# Patient Record
Sex: Female | Born: 1985 | Race: White | Hispanic: No | Marital: Single | Smoking: Former smoker
Health system: Southern US, Community
[De-identification: ages and names within clinical notes are randomized; demographics above are authoritative.]

## PROBLEM LIST (undated history)

## (undated) DIAGNOSIS — F17200 Nicotine dependence, unspecified, uncomplicated: Secondary | ICD-10-CM

## (undated) DIAGNOSIS — F32A Depression, unspecified: Secondary | ICD-10-CM

## (undated) DIAGNOSIS — F419 Anxiety disorder, unspecified: Secondary | ICD-10-CM

## (undated) DIAGNOSIS — F319 Bipolar disorder, unspecified: Secondary | ICD-10-CM

## (undated) DIAGNOSIS — F329 Major depressive disorder, single episode, unspecified: Secondary | ICD-10-CM

## (undated) DIAGNOSIS — F1290 Cannabis use, unspecified, uncomplicated: Secondary | ICD-10-CM

## (undated) HISTORY — DX: Depression, unspecified: F32.A

## (undated) HISTORY — PX: TYMPANOSTOMY: SHX2586

## (undated) HISTORY — DX: Major depressive disorder, single episode, unspecified: F32.9

## (undated) HISTORY — DX: Anxiety disorder, unspecified: F41.9

## (undated) HISTORY — DX: Bipolar disorder, unspecified: F31.9

---

## 2013-07-18 ENCOUNTER — Encounter (INDEPENDENT_AMBULATORY_CARE_PROVIDER_SITE_OTHER): Payer: Self-pay

## 2013-07-18 ENCOUNTER — Encounter: Payer: Self-pay | Admitting: Family Medicine

## 2013-07-18 ENCOUNTER — Ambulatory Visit: Payer: Self-pay | Admitting: Family Medicine

## 2013-07-18 VITALS — BP 107/64 | HR 70 | Temp 97.9°F | Ht 69.0 in | Wt 168.0 lb

## 2013-07-18 DIAGNOSIS — R21 Rash and other nonspecific skin eruption: Secondary | ICD-10-CM

## 2013-07-18 DIAGNOSIS — B354 Tinea corporis: Secondary | ICD-10-CM

## 2013-07-18 DIAGNOSIS — L218 Other seborrheic dermatitis: Secondary | ICD-10-CM

## 2013-07-18 DIAGNOSIS — L210 Seborrhea capitis: Secondary | ICD-10-CM | POA: Insufficient documentation

## 2013-07-18 MED ORDER — KETOCONAZOLE 2 % EX SHAM: 1.0000 "application " | MEDICATED_SHAMPOO | CUTANEOUS | Status: DC

## 2013-07-18 MED ORDER — KETOCONAZOLE-HYDROCORTISONE 2 & 1 % (CREAM) EX KIT: PACK | CUTANEOUS | Status: DC

## 2013-07-18 NOTE — Progress Notes (Signed)
Patient ID: Kimberly Potter, female   DOB: [birth year], 28 y.o.   MRN: [id number] SUBJECTIVE: CC: Chief Complaint  Patient presents with  . Acute Visit    rash x 2weeks using nerium firm cream . (NEW PT) states been here 7 yrs ago    HPI: Total weight loss at [employer] > 100lbs in 1 1/2 years. Works at [employer] now. Has excess skin. Rash broke out behind the right ear. And sudden dandruff of the scalp and rash on the left shoulder. No fevr. No STIs.  No new hair products. No past medical history on file. No past surgical history on file. History   Social History  . Marital Status: Single    Spouse Name: N/A    Number of Children: N/A  . Years of Education: N/A   Occupational History  . Not on file.   Social History Main Topics  . Smoking status: Never Smoker   . Smokeless tobacco: Not on file  . Alcohol Use: Not on file  . Drug Use: Not on file  . Sexual Activity: Not on file   Other Topics Concern  . Not on file   Social History Narrative  . No narrative on file   No family history on file. No current outpatient prescriptions on file prior to visit.   No current facility-administered medications on file prior to visit.   Not on File  There is no immunization history on file for this patient. Prior to Admission medications   Not on File     ROS: As above in the HPI. All other systems are stable or negative.  OBJECTIVE: APPEARANCE:  Patient in no acute distress.The patient appeared well nourished and normally developed. Acyanotic. Waist: VITAL SIGNS:BP 107/64  Pulse 70  Temp(Src) 97.9 F (36.6 C) (Oral)  Ht _0  (1.753 m)  Wt 168 lb (76.204 kg)  BMI 24.80 kg/m2  LMP 07/15/2013 [facility]  SKIN: warm and  Dry with rash on the right neck and behind the right ear. Scaly circar lesions with hypopigmentation. Lesions are 1 cm diameter. Central clearing.  small multiple 4 mm plaques on the left trapezius / shoulder. Marked dandruff of the scalp.  HEAD and Neck:  without JVD, Head and scalp: normal Eyes:No scleral icterus. Fundi normal, eye movements normal. Ears: Auricle normal, canal normal, Tympanic membranes normal, insufflation normal. Nose: normal Throat: normal Neck & thyroid: normal  CHEST & LUNGS: Chest wall: normal Lungs: Clear  CVS: Reveals the PMI to be normally located. Regular rhythm, First and Second Heart sounds are normal,  absence of murmurs, rubs or gallops. Peripheral vasculature: Radial pulses: normal Dorsal pedis pulses: normal Posterior pulses: normal  ABDOMEN:  Appearance: normal Benign, no organomegaly, no masses, no Abdominal Aortic enlargement. No Guarding , no rebound. No Bruits. Bowel sounds: normal  RECTAL: N/A GU: N/A  EXTREMETIES: nonedematous.  MUSCULOSKELETAL:  Spine: normal Joints: intact Soft tissue excess skin at the triceps and waist. And hips.   NEUROLOGIC: oriented to time,place and person; nonfocal. Strength is normal Sensory is normal Reflexes are normal Cranial Nerves are normal.  ASSESSMENT:  Dandruff - Plan: ketoconazole (NIZORAL) 2 % shampoo  Rash and nonspecific skin eruption - Plan: Ketoconazole-Hydrocortisone 2 & 1 % (CREAM) KIT  Tinea corporis - Plan: Ketoconazole-Hydrocortisone 2 & 1 % (CREAM) KIT  PLAN: Discussed wit patient.  Use hypoallergenic products.  No orders of the defined types were placed in this encounter.   Meds ordered this encounter  Medications  . ketoconazole (NIZORAL)  2 % shampoo    Sig: Apply 1 application topically 2 (two) times a week. Use for 8 weeks.    Dispense:  120 mL    Refill:  2  . Ketoconazole-Hydrocortisone 2 & 1 % (CREAM) KIT    Sig: Apply to rash on the neck twice a day for 2 weeks.    Dispense:  45 g    Refill:  1   There are no discontinued medications. Return if symptoms worsen or fail to improve. If no improvement in 2 weeks. Patient to call for a referral to dermatology.  Abem Shaddix P. Jacelyn Grip, M.D.

## 2015-10-10 ENCOUNTER — Ambulatory Visit (INDEPENDENT_AMBULATORY_CARE_PROVIDER_SITE_OTHER): Payer: BLUE CROSS/BLUE SHIELD | Admitting: Family Medicine

## 2015-10-10 ENCOUNTER — Encounter: Payer: Self-pay | Admitting: Family Medicine

## 2015-10-10 VITALS — BP 99/61 | HR 79 | Temp 98.0°F | Ht 68.5 in | Wt 224.4 lb

## 2015-10-10 DIAGNOSIS — F32A Depression, unspecified: Secondary | ICD-10-CM

## 2015-10-10 DIAGNOSIS — F418 Other specified anxiety disorders: Secondary | ICD-10-CM

## 2015-10-10 DIAGNOSIS — F419 Anxiety disorder, unspecified: Principal | ICD-10-CM

## 2015-10-10 DIAGNOSIS — F329 Major depressive disorder, single episode, unspecified: Secondary | ICD-10-CM

## 2015-10-10 MED ORDER — ESCITALOPRAM OXALATE 20 MG PO TABS: 20.0000 mg | ORAL_TABLET | Freq: Every day | ORAL | Status: DC

## 2015-10-10 MED ORDER — HYDROXYZINE HCL 10 MG PO TABS: 10.0000 mg | ORAL_TABLET | Freq: Three times a day (TID) | ORAL | Status: DC | PRN

## 2015-10-10 NOTE — Addendum Note (Signed)
Addended by: Arville CareETTINGER, JOSHUA on: 10/10/2015 09:19 AM   Modules accepted: Orders

## 2015-10-10 NOTE — Assessment & Plan Note (Signed)
Patient has mood swings and anger but does not appear that she has manic episodes where she stays up for days at a time. She is mostly depressed and down and lack of energy. We will try Lexapro 20 mg, she will take half for the first week and then increase and Atarax and see her back in 3-4 weeks
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
## 2015-10-12 ENCOUNTER — Ambulatory Visit: Payer: Self-pay | Admitting: Family Medicine

## 2015-11-07 ENCOUNTER — Ambulatory Visit: Payer: BLUE CROSS/BLUE SHIELD | Admitting: Family Medicine

## 2015-11-08 ENCOUNTER — Encounter: Payer: Self-pay | Admitting: Family Medicine

## 2016-01-11 ENCOUNTER — Encounter (HOSPITAL_COMMUNITY): Payer: Self-pay | Admitting: Emergency Medicine

## 2016-01-11 ENCOUNTER — Emergency Department (HOSPITAL_COMMUNITY): Payer: BLUE CROSS/BLUE SHIELD

## 2016-01-11 ENCOUNTER — Emergency Department (HOSPITAL_COMMUNITY)
Admission: EM | Admit: 2016-01-11 | Discharge: 2016-01-11 | Discharge status: Against Medical Advice | Payer: BLUE CROSS/BLUE SHIELD | Attending: Emergency Medicine | Admitting: Emergency Medicine

## 2016-01-11 DIAGNOSIS — Y92410 Unspecified street and highway as the place of occurrence of the external cause: Secondary | ICD-10-CM | POA: Diagnosis not present

## 2016-01-11 DIAGNOSIS — S0512XA Contusion of eyeball and orbital tissues, left eye, initial encounter: Secondary | ICD-10-CM | POA: Insufficient documentation

## 2016-01-11 DIAGNOSIS — S0012XA Contusion of left eyelid and periocular area, initial encounter: Secondary | ICD-10-CM

## 2016-01-11 DIAGNOSIS — S0592XA Unspecified injury of left eye and orbit, initial encounter: Secondary | ICD-10-CM | POA: Diagnosis present

## 2016-01-11 DIAGNOSIS — Z79899 Other long term (current) drug therapy: Secondary | ICD-10-CM | POA: Diagnosis not present

## 2016-01-11 DIAGNOSIS — H1132 Conjunctival hemorrhage, left eye: Secondary | ICD-10-CM

## 2016-01-11 DIAGNOSIS — F17210 Nicotine dependence, cigarettes, uncomplicated: Secondary | ICD-10-CM | POA: Insufficient documentation

## 2016-01-11 DIAGNOSIS — Y999 Unspecified external cause status: Secondary | ICD-10-CM | POA: Insufficient documentation

## 2016-01-11 DIAGNOSIS — Y9355 Activity, bike riding: Secondary | ICD-10-CM | POA: Insufficient documentation

## 2016-01-11 LAB — POC URINE PREG, ED: PREG TEST UR: NEGATIVE

## 2016-01-11 MED ORDER — TETRACAINE HCL 0.5 % OP SOLN
1.0000 [drp] | Freq: Once | OPHTHALMIC | Status: AC
  Administered 2016-01-11: 1 [drp] via OPHTHALMIC
  Filled 2016-01-11: qty 4

## 2016-01-11 MED ORDER — FLUORESCEIN SODIUM 1 MG OP STRP
1.0000 | ORAL_STRIP | Freq: Once | OPHTHALMIC | Status: AC
  Administered 2016-01-11: 1 via OPHTHALMIC
  Filled 2016-01-11: qty 1

## 2016-01-11 NOTE — ED Notes (Signed)
Pt concerned about CT scan and being exposed to radiation.  Explained CT to pt and risks vs benefits.  Pt initially was going to sign out AMA because she did not want any imaging done, but changed her mind.
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
## 2016-01-11 NOTE — ED Notes (Signed)
Pt seen walking out of department.  Did not tell staff she was leaving.
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
## 2016-05-29 ENCOUNTER — Encounter: Payer: Self-pay | Admitting: Family Medicine

## 2016-05-29 ENCOUNTER — Ambulatory Visit (INDEPENDENT_AMBULATORY_CARE_PROVIDER_SITE_OTHER): Payer: Self-pay | Admitting: Family Medicine

## 2016-05-29 VITALS — BP 130/84 | HR 67 | Temp 97.7°F | Ht 68.0 in | Wt 251.0 lb

## 2016-05-29 DIAGNOSIS — F32A Depression, unspecified: Secondary | ICD-10-CM

## 2016-05-29 DIAGNOSIS — F419 Anxiety disorder, unspecified: Principal | ICD-10-CM

## 2016-05-29 DIAGNOSIS — F418 Other specified anxiety disorders: Secondary | ICD-10-CM

## 2016-05-29 DIAGNOSIS — F329 Major depressive disorder, single episode, unspecified: Secondary | ICD-10-CM

## 2016-05-29 MED ORDER — BUSPIRONE HCL 10 MG PO TABS: 10.0000 mg | ORAL_TABLET | Freq: Two times a day (BID) | ORAL | 2 refills | Status: DC

## 2016-05-29 MED ORDER — ESCITALOPRAM OXALATE 20 MG PO TABS: 20.0000 mg | ORAL_TABLET | Freq: Every day | ORAL | 2 refills | Status: AC

## 2016-05-29 NOTE — Progress Notes (Signed)
BP (!) 135/97   Pulse 67   Temp 97.7 F (36.5 C) (Oral)   Ht 5\' 8"  (1.727 m)   Wt 251 lb (113.9 kg)   LMP 05/24/2016 (Approximate)   BMI 38.16 kg/m    Subjective:    Patient ID: Kimberly Potter, female    DOB: 02/14/1986, 31 y.o.   MRN: [id number]  HPI: Kimberly Potter is a 31 y.o. female presenting on 05/29/2016 for Depression (pt here today following up on her depression and anxiety, she is doing well on the lexapro but wants to discuss the atarax)   HPI Depression and anxiety recheck Patient is coming in today for depression and anxiety recheck. Patient says that she has not been doing well recently. She ran out of her Lexapro about a month ago and has been having a lot of issues and she started to have problems at school again and her grades are dropping and she is afraid that she is going have to drop out of school again which was the problem that she was having previously. She says she has some feelings of hopelessness but denies any suicidal ideations or thoughts of hurting herself. She says that even when she was on the Lexapro is Atarax. She still felt like she had a lot of social anxiety was not getting out not doing the things as much that she wanted. She still felt like she would have problems interacting with other people at school or in the hall going to school which would then sometimes keep her from wanting to go to school. On the Lexapro though that was improved and she was going to classes but now that she's been off of it again she's not going to classes and  Relevant past medical, surgical, family and social history reviewed and updated as indicated. Interim medical history since our last visit reviewed. Allergies and medications reviewed and updated.  Review of Systems  Constitutional: Negative for chills and fever.  Respiratory: Negative for chest tightness and shortness of breath.   Cardiovascular: Negative for chest pain and leg swelling.  Genitourinary: Negative for  difficulty urinating and dysuria.  Musculoskeletal: Negative for back pain and gait problem.  Skin: Negative for rash.  Neurological: Negative for dizziness, light-headedness and headaches.  Psychiatric/Behavioral: Positive for decreased concentration, dysphoric mood and sleep disturbance. Negative for agitation, behavioral problems, self-injury and suicidal ideas. The patient is nervous/anxious.   All other systems reviewed and are negative.   Per HPI unless specifically indicated above      Objective:    BP (!) 135/97   Pulse 67   Temp 97.7 F (36.5 C) (Oral)   Ht 5\' 8"  (1.727 m)   Wt 251 lb (113.9 kg)   LMP 05/24/2016 (Approximate)   BMI 38.16 kg/m   Wt Readings from Last 3 Encounters:  05/29/16 251 lb (113.9 kg)  01/11/16 235 lb (106.6 kg)  10/10/15 224 lb 6.4 oz (101.8 kg)    Physical Exam  Constitutional: She is oriented to person, place, and time. She appears well-developed and well-nourished. No distress.  Eyes: Conjunctivae are normal.  Cardiovascular: Normal rate, regular rhythm, normal heart sounds and intact distal pulses.   No murmur heard. Pulmonary/Chest: Effort normal and breath sounds normal. No respiratory distress. She has no wheezes. She has no rales.  Musculoskeletal: Normal range of motion. She exhibits no edema or tenderness.  Neurological: She is alert and oriented to person, place, and time. Coordination normal.  Skin: Skin is warm and  dry. No rash noted. She is not diaphoretic.  Psychiatric: Her behavior is normal. Judgment and thought content normal. Her mood appears anxious. She exhibits a depressed mood. She expresses no suicidal ideation. She expresses no suicidal plans.  Nursing note and vitals reviewed.     Assessment & Plan:   Problem List Items Addressed This Visit      Other   Anxiety and depression - Primary   Relevant Medications   busPIRone (BUSPAR) 10 MG tablet   escitalopram (LEXAPRO) 20 MG tablet       Follow up  plan: Return in about 3 months (around 08/26/2016), or if symptoms worsen or fail to improve, for Anxiety and depression recheck.  Counseling provided for all of the vaccine components No orders of the defined types were placed in this encounter.   Arville Care, MD [facility] 05/29/2016, 10:30 AM

## 2016-10-14 ENCOUNTER — Other Ambulatory Visit: Payer: Self-pay | Admitting: Family Medicine

## 2016-10-14 DIAGNOSIS — F419 Anxiety disorder, unspecified: Principal | ICD-10-CM

## 2016-10-14 DIAGNOSIS — F329 Major depressive disorder, single episode, unspecified: Secondary | ICD-10-CM

## 2017-09-01 ENCOUNTER — Emergency Department (HOSPITAL_COMMUNITY)
Admission: EM | Admit: 2017-09-01 | Discharge: 2017-09-02 | Discharge status: Against Medical Advice | Payer: Self-pay | Attending: Emergency Medicine | Admitting: Emergency Medicine

## 2017-09-01 ENCOUNTER — Emergency Department (HOSPITAL_COMMUNITY): Payer: Self-pay

## 2017-09-01 ENCOUNTER — Other Ambulatory Visit: Payer: Self-pay

## 2017-09-01 ENCOUNTER — Encounter (HOSPITAL_COMMUNITY): Payer: Self-pay | Admitting: *Deleted

## 2017-09-01 DIAGNOSIS — F17210 Nicotine dependence, cigarettes, uncomplicated: Secondary | ICD-10-CM | POA: Insufficient documentation

## 2017-09-01 DIAGNOSIS — Y9289 Other specified places as the place of occurrence of the external cause: Secondary | ICD-10-CM | POA: Insufficient documentation

## 2017-09-01 DIAGNOSIS — Z79899 Other long term (current) drug therapy: Secondary | ICD-10-CM | POA: Insufficient documentation

## 2017-09-01 DIAGNOSIS — S82142A Displaced bicondylar fracture of left tibia, initial encounter for closed fracture: Secondary | ICD-10-CM

## 2017-09-01 DIAGNOSIS — Y9316 Activity, rowing, canoeing, kayaking, rafting and tubing: Secondary | ICD-10-CM | POA: Insufficient documentation

## 2017-09-01 DIAGNOSIS — Y998 Other external cause status: Secondary | ICD-10-CM | POA: Insufficient documentation

## 2017-09-01 DIAGNOSIS — S82291A Other fracture of shaft of right tibia, initial encounter for closed fracture: Secondary | ICD-10-CM | POA: Insufficient documentation

## 2017-09-01 DIAGNOSIS — Y33XXXA Other specified events, undetermined intent, initial encounter: Secondary | ICD-10-CM | POA: Insufficient documentation

## 2017-09-01 MED ORDER — HYDROCODONE-ACETAMINOPHEN 5-325 MG PO TABS
2.0000 | ORAL_TABLET | Freq: Once | ORAL | Status: DC
  Filled 2017-09-01: qty 2

## 2017-09-01 MED ORDER — IBUPROFEN 800 MG PO TABS
800.0000 mg | ORAL_TABLET | Freq: Once | ORAL | Status: DC
  Filled 2017-09-01: qty 1

## 2017-09-01 MED ORDER — PROMETHAZINE HCL 12.5 MG PO TABS
12.5000 mg | ORAL_TABLET | Freq: Once | ORAL | Status: DC
  Filled 2017-09-01: qty 1

## 2017-09-01 MED ORDER — HYDROCODONE-ACETAMINOPHEN 5-325 MG PO TABS
2.0000 | ORAL_TABLET | Freq: Once | ORAL | Status: AC
  Administered 2017-09-01: 2 via ORAL
  Filled 2017-09-01: qty 2

## 2017-09-01 MED ORDER — PROMETHAZINE HCL 12.5 MG PO TABS
12.5000 mg | ORAL_TABLET | Freq: Once | ORAL | Status: AC
  Administered 2017-09-01: 12.5 mg via ORAL
  Filled 2017-09-01: qty 1

## 2017-09-01 MED ORDER — LORAZEPAM 1 MG PO TABS
1.0000 mg | ORAL_TABLET | Freq: Once | ORAL | Status: AC
  Administered 2017-09-01: 1 mg via ORAL
  Filled 2017-09-01: qty 1
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
## 2017-09-01 NOTE — ED Notes (Signed)
Writer accompanied Ivery Quale, PA to discuss plan of care.

## 2017-09-01 NOTE — ED Notes (Signed)
Patient transported to CT 
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
## 2017-09-02 MED ORDER — HYDROCODONE-ACETAMINOPHEN 5-325 MG PO TABS: 1.0000 | ORAL_TABLET | ORAL | 0 refills | Status: DC | PRN

## 2017-09-02 MED ORDER — IBUPROFEN 600 MG PO TABS: 600.0000 mg | ORAL_TABLET | Freq: Four times a day (QID) | ORAL | 0 refills | Status: DC
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
## 2017-09-02 NOTE — ED Notes (Signed)
Pt refusing an IV and blood work for admission and possible transfer to [facility].

## 2017-09-02 NOTE — ED Notes (Addendum)
Pt wheeled to waiting room. Pt was explained the risks of leaving AMA by this nurse. Pt verbalized understanding of the risks of leaving AMA.

## 2017-09-02 NOTE — ED Notes (Signed)
Pt refused vital signs.

## 2017-09-02 NOTE — Discharge Instructions (Signed)
You have a fracture of your left knee known as a tibial plateau fracture.  The joint space is involved.  This is a serious fracture.  You have opted not to be admitted to the [facility] tonight, and not to allow the orthopedic specialist to evaluate you at the [facility] campus.  Please keep your leg elevated above your waist when you are sitting and above your heart when you are lying down.  Please apply ice.  You may use ibuprofen with breakfast, lunch, dinner, and at bedtime.  You may use Norco for more severe pain.  This medication may cause drowsiness, please use it with caution.  Please see your primary physician, or the orthopedic specialist as soon as possible concerning your fracture and the management of your fracture.  Please return to the emergency department if you should change her mind about management of your fracture.

## 2017-09-09 ENCOUNTER — Inpatient Hospital Stay: Admit: 2017-09-09 | Payer: No Typology Code available for payment source | Admitting: Orthopedic Surgery

## 2017-09-09 ENCOUNTER — Other Ambulatory Visit: Payer: Self-pay

## 2017-09-09 ENCOUNTER — Encounter (HOSPITAL_COMMUNITY): Payer: Self-pay | Admitting: *Deleted

## 2017-09-09 SURGERY — FIXATION, FRACTURE, INTERTROCHANTERIC, WITH INTRAMEDULLARY ROD
Anesthesia: General

## 2017-09-09 SURGERY — OPEN REDUCTION INTERNAL FIXATION (ORIF) ANKLE FRACTURE
Anesthesia: General | Laterality: Left

## 2017-09-09 NOTE — Progress Notes (Signed)
Pt denies SOB, chest pain, and being under the care of a cardiologist. Pt denies having a stress test, echo and cardiac cath. Pt denies having an EKG and chest x ray within the last year. Pt denies recent labs. Pt made aware to stop taking vitamins, fish oil, and herbal medications. Do not take any NSAIDs ie: Ibuprofen, Advil, Naproxen (Aleve), Motrin, BC and Goody Powder. Pt verbalized understanding of all pre-op instructions.

## 2017-09-10 ENCOUNTER — Encounter (HOSPITAL_COMMUNITY)
Admission: RE | Disposition: A | Discharge status: Home / Self Care | Payer: Self-pay | Source: Ambulatory Visit | Attending: Orthopedic Surgery

## 2017-09-10 ENCOUNTER — Encounter (HOSPITAL_COMMUNITY): Payer: Self-pay | Admitting: Surgery

## 2017-09-10 ENCOUNTER — Inpatient Hospital Stay (HOSPITAL_COMMUNITY): Payer: Self-pay

## 2017-09-10 ENCOUNTER — Inpatient Hospital Stay (HOSPITAL_COMMUNITY): Payer: Self-pay | Admitting: Certified Registered Nurse Anesthetist

## 2017-09-10 ENCOUNTER — Inpatient Hospital Stay (HOSPITAL_COMMUNITY)
Admission: RE | Admit: 2017-09-10 | Discharge: 2017-09-12 | DRG: 493 | Disposition: A | Discharge status: Home / Self Care | Payer: Self-pay | Source: Ambulatory Visit | Attending: Orthopedic Surgery | Admitting: Orthopedic Surgery

## 2017-09-10 DIAGNOSIS — S83289A Other tear of lateral meniscus, current injury, unspecified knee, initial encounter: Secondary | ICD-10-CM | POA: Diagnosis present

## 2017-09-10 DIAGNOSIS — Z79899 Other long term (current) drug therapy: Secondary | ICD-10-CM

## 2017-09-10 DIAGNOSIS — E559 Vitamin D deficiency, unspecified: Secondary | ICD-10-CM | POA: Diagnosis present

## 2017-09-10 DIAGNOSIS — D62 Acute posthemorrhagic anemia: Secondary | ICD-10-CM | POA: Diagnosis not present

## 2017-09-10 DIAGNOSIS — F1290 Cannabis use, unspecified, uncomplicated: Secondary | ICD-10-CM | POA: Diagnosis present

## 2017-09-10 DIAGNOSIS — M898X9 Other specified disorders of bone, unspecified site: Secondary | ICD-10-CM | POA: Diagnosis present

## 2017-09-10 DIAGNOSIS — Z419 Encounter for procedure for purposes other than remedying health state, unspecified: Secondary | ICD-10-CM

## 2017-09-10 DIAGNOSIS — S82142A Displaced bicondylar fracture of left tibia, initial encounter for closed fracture: Principal | ICD-10-CM | POA: Diagnosis present

## 2017-09-10 DIAGNOSIS — F319 Bipolar disorder, unspecified: Secondary | ICD-10-CM | POA: Diagnosis present

## 2017-09-10 DIAGNOSIS — F329 Major depressive disorder, single episode, unspecified: Secondary | ICD-10-CM | POA: Diagnosis present

## 2017-09-10 DIAGNOSIS — F17210 Nicotine dependence, cigarettes, uncomplicated: Secondary | ICD-10-CM | POA: Diagnosis present

## 2017-09-10 DIAGNOSIS — F17209 Nicotine dependence, unspecified, with unspecified nicotine-induced disorders: Secondary | ICD-10-CM

## 2017-09-10 DIAGNOSIS — F419 Anxiety disorder, unspecified: Secondary | ICD-10-CM | POA: Diagnosis present

## 2017-09-10 DIAGNOSIS — F17200 Nicotine dependence, unspecified, uncomplicated: Secondary | ICD-10-CM | POA: Diagnosis present

## 2017-09-10 HISTORY — DX: Cannabis use, unspecified, uncomplicated: F12.90

## 2017-09-10 HISTORY — DX: Nicotine dependence, unspecified, uncomplicated: F17.200

## 2017-09-10 HISTORY — PX: ORIF TIBIA PLATEAU: SHX2132

## 2017-09-10 LAB — URINALYSIS, ROUTINE W REFLEX MICROSCOPIC
BILIRUBIN URINE: NEGATIVE
GLUCOSE, UA: NEGATIVE mg/dL
Ketones, ur: 5 mg/dL — AB
Leukocytes, UA: NEGATIVE
NITRITE: NEGATIVE
PH: 5 (ref 5.0–8.0)
Protein, ur: NEGATIVE mg/dL
SPECIFIC GRAVITY, URINE: 1.016 (ref 1.005–1.030)

## 2017-09-10 LAB — POCT I-STAT, CHEM 8
BUN: 10 mg/dL (ref 6–20)
CALCIUM ION: 1.18 mmol/L (ref 1.15–1.40)
Chloride: 101 mmol/L (ref 101–111)
Creatinine, Ser: 0.6 mg/dL (ref 0.44–1.00)
Glucose, Bld: 88 mg/dL (ref 65–99)
HCT: 37 % (ref 36.0–46.0)
HEMOGLOBIN: 12.6 g/dL (ref 12.0–15.0)
Potassium: 3.5 mmol/L (ref 3.5–5.1)
Sodium: 141 mmol/L (ref 135–145)
TCO2: 24 mmol/L (ref 22–32)

## 2017-09-10 LAB — CBC WITH DIFFERENTIAL/PLATELET
Abs Immature Granulocytes: 0 10*3/uL (ref 0.0–0.1)
BASOS ABS: 0.1 10*3/uL (ref 0.0–0.1)
BASOS PCT: 1 %
EOS ABS: 0.3 10*3/uL (ref 0.0–0.7)
EOS PCT: 4 %
HCT: 34.9 % — ABNORMAL LOW (ref 36.0–46.0)
Hemoglobin: 11.4 g/dL — ABNORMAL LOW (ref 12.0–15.0)
Immature Granulocytes: 0 %
Lymphocytes Relative: 25 %
Lymphs Abs: 2.1 10*3/uL (ref 0.7–4.0)
MCH: 31.3 pg (ref 26.0–34.0)
MCHC: 32.7 g/dL (ref 30.0–36.0)
MCV: 95.9 fL (ref 78.0–100.0)
MONOS PCT: 9 %
Monocytes Absolute: 0.7 10*3/uL (ref 0.1–1.0)
NEUTROS ABS: 4.9 10*3/uL (ref 1.7–7.7)
Neutrophils Relative %: 61 %
PLATELETS: 220 10*3/uL (ref 150–400)
RBC: 3.64 MIL/uL — ABNORMAL LOW (ref 3.87–5.11)
RDW: 12.4 % (ref 11.5–15.5)
WBC: 8.1 10*3/uL (ref 4.0–10.5)

## 2017-09-10 LAB — COMPREHENSIVE METABOLIC PANEL
ALBUMIN: 3.4 g/dL — AB (ref 3.5–5.0)
ALK PHOS: 49 U/L (ref 38–126)
ALT: 15 U/L (ref 14–54)
ANION GAP: 9 (ref 5–15)
AST: 24 U/L (ref 15–41)
BILIRUBIN TOTAL: 0.4 mg/dL (ref 0.3–1.2)
BUN: 8 mg/dL (ref 6–20)
CALCIUM: 8.7 mg/dL — AB (ref 8.9–10.3)
CO2: 26 mmol/L (ref 22–32)
CREATININE: 0.61 mg/dL (ref 0.44–1.00)
Chloride: 104 mmol/L (ref 101–111)
GFR calc Af Amer: >60 mL/min (ref >60)
GFR calc non Af Amer: >60 mL/min (ref >60)
Glucose, Bld: 105 mg/dL — ABNORMAL HIGH (ref 65–99)
Potassium: 3.5 mmol/L (ref 3.5–5.1)
SODIUM: 139 mmol/L (ref 135–145)
TOTAL PROTEIN: 6.7 g/dL (ref 6.5–8.1)

## 2017-09-10 LAB — RAPID URINE DRUG SCREEN, HOSP PERFORMED
AMPHETAMINES: NOT DETECTED
BARBITURATES: NOT DETECTED
Benzodiazepines: NOT DETECTED
Cocaine: NOT DETECTED
Opiates: POSITIVE — AB
TETRAHYDROCANNABINOL: POSITIVE — AB

## 2017-09-10 LAB — PROTIME-INR
INR: 0.98
Prothrombin Time: 12.9 seconds (ref 11.4–15.2)

## 2017-09-10 LAB — POCT PREGNANCY, URINE: Preg Test, Ur: NEGATIVE

## 2017-09-10 LAB — APTT: aPTT: 31 seconds (ref 24–36)

## 2017-09-10 SURGERY — OPEN REDUCTION INTERNAL FIXATION (ORIF) TIBIAL PLATEAU
Anesthesia: General | Site: Knee | Laterality: Left

## 2017-09-10 MED ORDER — CEFAZOLIN SODIUM-DEXTROSE 1-4 GM/50ML-% IV SOLN
1.0000 g | Freq: Four times a day (QID) | INTRAVENOUS | Status: AC
  Administered 2017-09-10 – 2017-09-11 (×3): 1 g via INTRAVENOUS
  Filled 2017-09-10 (×3): qty 50

## 2017-09-10 MED ORDER — METOCLOPRAMIDE HCL 5 MG/ML IJ SOLN: 5.0000 mg | Freq: Three times a day (TID) | INTRAMUSCULAR | Status: DC | PRN

## 2017-09-10 MED ORDER — HYDROMORPHONE HCL 2 MG/ML IJ SOLN
INTRAMUSCULAR | Status: AC
  Administered 2017-09-10: 0.5 mg via INTRAVENOUS
  Filled 2017-09-10: qty 1

## 2017-09-10 MED ORDER — LIDOCAINE 2% (20 MG/ML) 5 ML SYRINGE
INTRAMUSCULAR | Status: DC | PRN
  Administered 2017-09-10: 100 mg via INTRAVENOUS

## 2017-09-10 MED ORDER — ACETAMINOPHEN 10 MG/ML IV SOLN
INTRAVENOUS | Status: AC
Start: 2017-09-10 — End: ?
  Filled 2017-09-10: qty 100

## 2017-09-10 MED ORDER — MIDAZOLAM HCL 2 MG/2ML IJ SOLN
INTRAMUSCULAR | Status: DC | PRN
  Administered 2017-09-10: 2 mg via INTRAVENOUS

## 2017-09-10 MED ORDER — OXYCODONE HCL 5 MG PO TABS
5.0000 mg | ORAL_TABLET | ORAL | Status: DC | PRN
  Administered 2017-09-10: 10 mg via ORAL
  Filled 2017-09-10 (×3): qty 2

## 2017-09-10 MED ORDER — DIPHENHYDRAMINE HCL 12.5 MG/5ML PO ELIX: 12.5000 mg | ORAL_SOLUTION | ORAL | Status: DC | PRN

## 2017-09-10 MED ORDER — HYDROMORPHONE HCL 2 MG/ML IJ SOLN
0.5000 mg | INTRAMUSCULAR | Status: DC | PRN
  Administered 2017-09-10 – 2017-09-12 (×7): 1 mg via INTRAVENOUS
  Filled 2017-09-10 (×7): qty 1

## 2017-09-10 MED ORDER — OXYCODONE HCL 5 MG PO TABS
10.0000 mg | ORAL_TABLET | ORAL | Status: DC | PRN
  Administered 2017-09-10 – 2017-09-11 (×2): 15 mg via ORAL
  Filled 2017-09-10 (×2): qty 3

## 2017-09-10 MED ORDER — ROCURONIUM BROMIDE 10 MG/ML (PF) SYRINGE
PREFILLED_SYRINGE | INTRAVENOUS | Status: DC | PRN
  Administered 2017-09-10: 10 mg via INTRAVENOUS
  Administered 2017-09-10: 50 mg via INTRAVENOUS
  Administered 2017-09-10: 20 mg via INTRAVENOUS

## 2017-09-10 MED ORDER — METHOCARBAMOL 500 MG PO TABS
500.0000 mg | ORAL_TABLET | Freq: Once | ORAL | Status: AC
  Administered 2017-09-10: 1000 mg via ORAL

## 2017-09-10 MED ORDER — FENTANYL CITRATE (PF) 250 MCG/5ML IJ SOLN
INTRAMUSCULAR | Status: AC
  Filled 2017-09-10: qty 5

## 2017-09-10 MED ORDER — ACETAMINOPHEN 10 MG/ML IV SOLN
INTRAVENOUS | Status: DC | PRN
  Administered 2017-09-10: 1000 mg via INTRAVENOUS

## 2017-09-10 MED ORDER — ONDANSETRON HCL 4 MG/2ML IJ SOLN
INTRAMUSCULAR | Status: DC | PRN
  Administered 2017-09-10: 4 mg via INTRAVENOUS

## 2017-09-10 MED ORDER — ROCURONIUM BROMIDE 50 MG/5ML IV SOLN
INTRAVENOUS | Status: AC
  Filled 2017-09-10: qty 3

## 2017-09-10 MED ORDER — ACETAMINOPHEN 325 MG PO TABS: 325.0000 mg | ORAL_TABLET | Freq: Four times a day (QID) | ORAL | Status: DC | PRN

## 2017-09-10 MED ORDER — 0.9 % SODIUM CHLORIDE (POUR BTL) OPTIME
TOPICAL | Status: DC | PRN
  Administered 2017-09-10: 1000 mL

## 2017-09-10 MED ORDER — BISACODYL 5 MG PO TBEC: 5.0000 mg | DELAYED_RELEASE_TABLET | Freq: Every day | ORAL | Status: DC | PRN

## 2017-09-10 MED ORDER — MEPERIDINE HCL 50 MG/ML IJ SOLN: 6.2500 mg | INTRAMUSCULAR | Status: DC | PRN

## 2017-09-10 MED ORDER — CHLORHEXIDINE GLUCONATE 4 % EX LIQD: 60.0000 mL | Freq: Once | CUTANEOUS | Status: DC

## 2017-09-10 MED ORDER — PROPOFOL 10 MG/ML IV BOLUS
INTRAVENOUS | Status: AC
  Filled 2017-09-10: qty 20

## 2017-09-10 MED ORDER — MIDAZOLAM HCL 2 MG/2ML IJ SOLN
INTRAMUSCULAR | Status: AC
  Filled 2017-09-10: qty 2

## 2017-09-10 MED ORDER — METOCLOPRAMIDE HCL 5 MG PO TABS: 5.0000 mg | ORAL_TABLET | Freq: Three times a day (TID) | ORAL | Status: DC | PRN

## 2017-09-10 MED ORDER — DOCUSATE SODIUM 100 MG PO CAPS
100.0000 mg | ORAL_CAPSULE | Freq: Two times a day (BID) | ORAL | Status: DC
  Administered 2017-09-11 – 2017-09-12 (×3): 100 mg via ORAL
  Filled 2017-09-10 (×4): qty 1

## 2017-09-10 MED ORDER — PROMETHAZINE HCL 25 MG/ML IJ SOLN: 6.2500 mg | INTRAMUSCULAR | Status: DC | PRN

## 2017-09-10 MED ORDER — METHOCARBAMOL 500 MG PO TABS
ORAL_TABLET | ORAL | Status: AC
  Filled 2017-09-10: qty 2

## 2017-09-10 MED ORDER — ONDANSETRON HCL 4 MG/2ML IJ SOLN: 4.0000 mg | Freq: Four times a day (QID) | INTRAMUSCULAR | Status: DC | PRN

## 2017-09-10 MED ORDER — HYDROXYZINE HCL 25 MG PO TABS: 50.0000 mg | ORAL_TABLET | Freq: Three times a day (TID) | ORAL | Status: DC | PRN

## 2017-09-10 MED ORDER — ARTIFICIAL TEARS OPHTHALMIC OINT
TOPICAL_OINTMENT | OPHTHALMIC | Status: AC
  Filled 2017-09-10: qty 3.5

## 2017-09-10 MED ORDER — POLYETHYLENE GLYCOL 3350 17 G PO PACK: 17.0000 g | PACK | Freq: Every day | ORAL | Status: DC

## 2017-09-10 MED ORDER — CEFAZOLIN SODIUM-DEXTROSE 2-4 GM/100ML-% IV SOLN
2.0000 g | INTRAVENOUS | Status: AC
  Administered 2017-09-10: 2 g via INTRAVENOUS
  Filled 2017-09-10: qty 100

## 2017-09-10 MED ORDER — OXYCODONE HCL 5 MG PO TABS
ORAL_TABLET | ORAL | Status: AC
  Administered 2017-09-10: 5 mg via ORAL
  Filled 2017-09-10: qty 1

## 2017-09-10 MED ORDER — SUGAMMADEX SODIUM 200 MG/2ML IV SOLN
INTRAVENOUS | Status: DC | PRN
  Administered 2017-09-10: 200 mg via INTRAVENOUS

## 2017-09-10 MED ORDER — HYDROMORPHONE HCL 2 MG/ML IJ SOLN: 0.2500 mg | INTRAMUSCULAR | Status: DC | PRN

## 2017-09-10 MED ORDER — PROPOFOL 10 MG/ML IV BOLUS
INTRAVENOUS | Status: DC | PRN
  Administered 2017-09-10: 170 mg via INTRAVENOUS
  Administered 2017-09-10: 30 mg via INTRAVENOUS

## 2017-09-10 MED ORDER — ACETAMINOPHEN 500 MG PO TABS
1000.0000 mg | ORAL_TABLET | Freq: Four times a day (QID) | ORAL | Status: DC
  Administered 2017-09-10 – 2017-09-12 (×8): 1000 mg via ORAL
  Filled 2017-09-10 (×8): qty 2

## 2017-09-10 MED ORDER — OXYCODONE HCL 5 MG PO TABS
5.0000 mg | ORAL_TABLET | Freq: Once | ORAL | Status: AC | PRN
  Administered 2017-09-10: 5 mg via ORAL

## 2017-09-10 MED ORDER — OXYCODONE HCL 5 MG/5ML PO SOLN: 5.0000 mg | Freq: Once | ORAL | Status: AC | PRN

## 2017-09-10 MED ORDER — LACTATED RINGERS IV SOLN
INTRAVENOUS | Status: DC
  Administered 2017-09-10 (×2): via INTRAVENOUS

## 2017-09-10 MED ORDER — DEXAMETHASONE SODIUM PHOSPHATE 10 MG/ML IJ SOLN
INTRAMUSCULAR | Status: DC | PRN
  Administered 2017-09-10: 10 mg via INTRAVENOUS

## 2017-09-10 MED ORDER — FENTANYL CITRATE (PF) 250 MCG/5ML IJ SOLN
INTRAMUSCULAR | Status: DC | PRN
  Administered 2017-09-10 (×3): 50 ug via INTRAVENOUS
  Administered 2017-09-10: 100 ug via INTRAVENOUS
  Administered 2017-09-10: 50 ug via INTRAVENOUS
  Administered 2017-09-10 (×2): 25 ug via INTRAVENOUS
  Administered 2017-09-10 (×3): 50 ug via INTRAVENOUS

## 2017-09-10 MED ORDER — ONDANSETRON HCL 4 MG PO TABS: 4.0000 mg | ORAL_TABLET | Freq: Four times a day (QID) | ORAL | Status: DC | PRN

## 2017-09-10 MED ORDER — HYDROMORPHONE HCL 2 MG/ML IJ SOLN
0.3000 mg | INTRAMUSCULAR | Status: DC | PRN
  Administered 2017-09-10 (×4): 0.5 mg via INTRAVENOUS

## 2017-09-10 MED ORDER — LIDOCAINE 2% (20 MG/ML) 5 ML SYRINGE
INTRAMUSCULAR | Status: AC
  Filled 2017-09-10: qty 10

## 2017-09-10 MED ORDER — POTASSIUM CHLORIDE IN NACL 20-0.9 MEQ/L-% IV SOLN
INTRAVENOUS | Status: DC
  Administered 2017-09-10: 16:00:00 via INTRAVENOUS
  Filled 2017-09-10 (×3): qty 1000

## 2017-09-10 MED ORDER — METHOCARBAMOL 500 MG PO TABS
1000.0000 mg | ORAL_TABLET | Freq: Four times a day (QID) | ORAL | Status: DC
  Administered 2017-09-10 – 2017-09-12 (×10): 1000 mg via ORAL
  Filled 2017-09-10 (×10): qty 2

## 2017-09-10 MED ORDER — DEXAMETHASONE SODIUM PHOSPHATE 10 MG/ML IJ SOLN
INTRAMUSCULAR | Status: AC
  Filled 2017-09-10: qty 2

## 2017-09-10 MED ORDER — ONDANSETRON HCL 4 MG/2ML IJ SOLN
INTRAMUSCULAR | Status: AC
  Filled 2017-09-10: qty 4

## 2017-09-10 MED ORDER — PHENYLEPHRINE 40 MCG/ML (10ML) SYRINGE FOR IV PUSH (FOR BLOOD PRESSURE SUPPORT)
PREFILLED_SYRINGE | INTRAVENOUS | Status: AC
  Filled 2017-09-10: qty 10

## 2017-09-10 MED ORDER — METHOCARBAMOL 1000 MG/10ML IJ SOLN: 500.0000 mg | Freq: Four times a day (QID) | INTRAVENOUS | Status: DC

## 2017-09-10 MED ORDER — ENOXAPARIN SODIUM 40 MG/0.4ML ~~LOC~~ SOLN
40.0000 mg | SUBCUTANEOUS | Status: DC
  Administered 2017-09-11 – 2017-09-12 (×2): 40 mg via SUBCUTANEOUS
  Filled 2017-09-10 (×2): qty 0.4

## 2017-09-10 SURGICAL SUPPLY — 82 items
BANDAGE ACE 4X5 VEL STRL LF (GAUZE/BANDAGES/DRESSINGS) ×3 IMPLANT
BANDAGE ACE 6X5 VEL STRL LF (GAUZE/BANDAGES/DRESSINGS) ×3 IMPLANT
BANDAGE ELASTIC 6 VELCRO ST LF (GAUZE/BANDAGES/DRESSINGS) ×2 IMPLANT
BANDAGE ESMARK 6X9 LF (GAUZE/BANDAGES/DRESSINGS) ×1 IMPLANT
BLADE CLIPPER SURG (BLADE) IMPLANT
BLADE SURG 10 STRL SS (BLADE) ×3 IMPLANT
BLADE SURG 15 STRL LF DISP TIS (BLADE) ×1 IMPLANT
BLADE SURG 15 STRL SS (BLADE) ×3
BNDG CMPR 9X6 STRL LF SNTH (GAUZE/BANDAGES/DRESSINGS) ×1
BNDG CMPR MED 10X6 ELC LF (GAUZE/BANDAGES/DRESSINGS) ×1
BNDG COHESIVE 4X5 TAN STRL (GAUZE/BANDAGES/DRESSINGS) ×3 IMPLANT
BNDG ELASTIC 6X10 VLCR STRL LF (GAUZE/BANDAGES/DRESSINGS) ×2 IMPLANT
BNDG ESMARK 6X9 LF (GAUZE/BANDAGES/DRESSINGS) ×3
BNDG GAUZE ELAST 4 BULKY (GAUZE/BANDAGES/DRESSINGS) ×3 IMPLANT
BRUSH SCRUB SURG 4.25 DISP (MISCELLANEOUS) ×6 IMPLANT
CANISTER SUCT 3000ML PPV (MISCELLANEOUS) ×3 IMPLANT
COVER SURGICAL LIGHT HANDLE (MISCELLANEOUS) ×3 IMPLANT
CUFF TOURNIQUET SINGLE 34IN LL (TOURNIQUET CUFF) ×1 IMPLANT
DRAPE C-ARM 42X72 X-RAY (DRAPES) ×3 IMPLANT
DRAPE C-ARMOR (DRAPES) ×3 IMPLANT
DRAPE HALF SHEET 40X57 (DRAPES) IMPLANT
DRAPE INCISE IOBAN 66X45 STRL (DRAPES) ×3 IMPLANT
DRAPE U-SHAPE 47X51 STRL (DRAPES) ×3 IMPLANT
DRILL BIT 2.5X100 214235007 (MISCELLANEOUS) ×2 IMPLANT
DRILL BIT 2.7X100 214235006 DU (MISCELLANEOUS) ×2 IMPLANT
DRSG ADAPTIC 3X8 NADH LF (GAUZE/BANDAGES/DRESSINGS) ×3 IMPLANT
DRSG PAD ABDOMINAL 8X10 ST (GAUZE/BANDAGES/DRESSINGS) ×6 IMPLANT
ELECT REM PT RETURN 9FT ADLT (ELECTROSURGICAL) ×3
ELECTRODE REM PT RTRN 9FT ADLT (ELECTROSURGICAL) ×1 IMPLANT
GAUZE SPONGE 4X4 12PLY STRL (GAUZE/BANDAGES/DRESSINGS) ×3 IMPLANT
GLOVE BIO SURGEON STRL SZ7.5 (GLOVE) ×3 IMPLANT
GLOVE BIO SURGEON STRL SZ8 (GLOVE) ×3 IMPLANT
GLOVE BIOGEL PI IND STRL 7.5 (GLOVE) ×1 IMPLANT
GLOVE BIOGEL PI IND STRL 8 (GLOVE) ×1 IMPLANT
GLOVE BIOGEL PI INDICATOR 7.5 (GLOVE) ×2
GLOVE BIOGEL PI INDICATOR 8 (GLOVE) ×2
GOWN STRL REUS W/ TWL LRG LVL3 (GOWN DISPOSABLE) ×2 IMPLANT
GOWN STRL REUS W/ TWL XL LVL3 (GOWN DISPOSABLE) ×1 IMPLANT
GOWN STRL REUS W/TWL LRG LVL3 (GOWN DISPOSABLE) ×6
GOWN STRL REUS W/TWL XL LVL3 (GOWN DISPOSABLE) ×3
IMMOBILIZER KNEE 22 UNIV (SOFTGOODS) ×3 IMPLANT
K-WIRE ACE 1.6X6 (WIRE) ×15
KIT BASIN OR (CUSTOM PROCEDURE TRAY) ×3 IMPLANT
KIT TURNOVER KIT B (KITS) ×3 IMPLANT
KWIRE ACE 1.6X6 (WIRE) IMPLANT
NDL SUT 6 .5 CRC .975X.05 MAYO (NEEDLE) IMPLANT
NEEDLE MAYO TAPER (NEEDLE)
NS IRRIG 1000ML POUR BTL (IV SOLUTION) ×3 IMPLANT
PACK ORTHO EXTREMITY (CUSTOM PROCEDURE TRAY) ×3 IMPLANT
PAD ABD 8X10 STRL (GAUZE/BANDAGES/DRESSINGS) ×4 IMPLANT
PAD ARMBOARD 7.5X6 YLW CONV (MISCELLANEOUS) ×6 IMPLANT
PAD CAST 4YDX4 CTTN HI CHSV (CAST SUPPLIES) ×1 IMPLANT
PADDING CAST COTTON 4X4 STRL (CAST SUPPLIES) ×3
PADDING CAST COTTON 6X4 STRL (CAST SUPPLIES) ×5 IMPLANT
PLATE LOCK 5H STD LT PROX TIB (Plate) ×2 IMPLANT
SCREW CORTICAL 3.5MM 36MM (Screw) ×2 IMPLANT
SCREW CORTICAL 3.5MM 38MM (Screw) ×4 IMPLANT
SCREW LOCK 3.5X70 816135070 (Screw) ×4 IMPLANT
SCREW LOCK 3.5X75 816135075 DU (Screw) ×2 IMPLANT
SCREW LOCK CORT STAR 3.5X65 (Screw) ×2 IMPLANT
SCREW LOW PROF CORTICAL 3.5X80 (Screw) ×2 IMPLANT
SCREW LP 3.5X70MM (Screw) ×4 IMPLANT
SPONGE LAP 18X18 X RAY DECT (DISPOSABLE) ×3 IMPLANT
STAPLER VISISTAT 35W (STAPLE) ×3 IMPLANT
STOCKINETTE IMPERVIOUS LG (DRAPES) ×3 IMPLANT
SUCTION FRAZIER HANDLE 10FR (MISCELLANEOUS) ×2
SUCTION TUBE FRAZIER 10FR DISP (MISCELLANEOUS) ×1 IMPLANT
SUT ETHILON 3 0 PS 1 (SUTURE) ×4 IMPLANT
SUT PROLENE 0 CT 2 (SUTURE) ×6 IMPLANT
SUT VIC AB 0 CT1 27 (SUTURE) ×6
SUT VIC AB 0 CT1 27XBRD ANBCTR (SUTURE) ×1 IMPLANT
SUT VIC AB 1 CT1 27 (SUTURE) ×6
SUT VIC AB 1 CT1 27XBRD ANBCTR (SUTURE) ×1 IMPLANT
SUT VIC AB 2-0 CT1 27 (SUTURE) ×6
SUT VIC AB 2-0 CT1 TAPERPNT 27 (SUTURE) ×2 IMPLANT
TOWEL OR 17X24 6PK STRL BLUE (TOWEL DISPOSABLE) ×3 IMPLANT
TOWEL OR 17X26 10 PK STRL BLUE (TOWEL DISPOSABLE) ×6 IMPLANT
TRAY FOLEY MTR SLVR 16FR STAT (SET/KITS/TRAYS/PACK) ×2 IMPLANT
TUBE CONNECTING 12'X1/4 (SUCTIONS) ×1
TUBE CONNECTING 12X1/4 (SUCTIONS) ×2 IMPLANT
WATER STERILE IRR 1000ML POUR (IV SOLUTION) ×2 IMPLANT
YANKAUER SUCT BULB TIP NO VENT (SUCTIONS) ×3 IMPLANT

## 2017-09-10 NOTE — Plan of Care (Signed)
  Problem: Education: Goal: Knowledge of General Education information will improve Outcome: Progressing   

## 2017-09-10 NOTE — Progress Notes (Signed)
Orthopedic Tech Progress Note Patient Details:  Kimberly Potter 1986/03/06 [id number] Called [employer] for knee brace. Patient ID: [name], female   DOB: January 18, 1986, 32 y.o.   MRN: [id number]   [name] 09/10/2017, 3:46 PM

## 2017-09-10 NOTE — Progress Notes (Signed)
Orthopedic Tech Progress Note Patient Details:  Kimberly Potter [birth year] [id number]  Ortho Devices Ortho Device/Splint Location: applied ohf Ortho Device/Splint Interventions: Ordered, Application   Post Interventions Patient Tolerated: Well Instructions Provided: Care of device   [name] 09/10/2017, 5:13 PM

## 2017-09-10 NOTE — Transfer of Care (Signed)
Immediate Anesthesia Transfer of Care Note  Patient: Research scientist (life sciences)  Procedure(s) Performed: OPEN REDUCTION INTERNAL FIXATION (ORIF) TIBIAL PLATEAU (Left Knee)  Patient Location: PACU  Anesthesia Type:General  Level of Consciousness: awake, alert  and oriented  Airway & Oxygen Therapy: Patient Spontanous Breathing and Patient connected to nasal cannula oxygen  Post-op Assessment: Report given to RN and Post -op Vital signs reviewed and stable  Post vital signs: Reviewed and stable  Last Vitals:  Vitals Value Taken Time  BP 141/79 09/10/2017 11:40 AM  Temp    Pulse 102 09/10/2017 11:42 AM  Resp 16 09/10/2017 11:42 AM  SpO2 98 % 09/10/2017 11:42 AM  Vitals shown include unvalidated device data.  Last Pain:  Vitals:   09/10/17 0711  PainSc: 0-No pain      Patients Stated Pain Goal: 3 (09/10/17 0711)  Complications: No apparent anesthesia complications

## 2017-09-10 NOTE — Discharge Instructions (Addendum)
Orthopaedic Trauma Service Discharge Instructions   General Discharge Instructions  WEIGHT BEARING STATUS: Nonweightbearing Left leg   RANGE OF MOTION/ACTIVITY: Unrestricted Range of motion of left knee. Continue to do all exercises that were taught to you by physical therapy. Do not rest with pillow under the bend of the knee. Rest with knee completely straight  Wound Care: daily wound care starting when you get home. See below   DVT/PE prophylaxis: Lovenox 40 mg subcutaneous injection daily x 21 days   Discharge Wound Care Instructions  Do NOT apply any ointments, solutions or lotions to pin sites or surgical wounds.  These prevent needed drainage and even though solutions like hydrogen peroxide kill bacteria, they also damage cells lining the pin sites that help fight infection.  Applying lotions or ointments can keep the wounds moist and can cause them to breakdown and open up as well. This can increase the risk for infection. When in doubt call the office.  Surgical incisions should be dressed daily.  If any drainage is noted, use one layer of adaptic, then gauze, Kerlix, and an ace wrap.  Once the incision is completely dry and without drainage, it may be left open to air out.  Showering may begin 36-48 hours later.  Cleaning gently with soap and water.  Traumatic wounds should be dressed daily as well.    One layer of adaptic, gauze, Kerlix, then ace wrap.  The adaptic can be discontinued once the draining has ceased    If you have a wet to dry dressing: wet the gauze with saline the squeeze as much saline out so the gauze is moist (not soaking wet), place moistened gauze over wound, then place a dry gauze over the moist one, followed by Kerlix wrap, then ace wrap.    Diet: as you were eating previously.  Can use over the counter stool softeners and bowel preparations, such as Miralax, to help with bowel movements.  Narcotics can be constipating.  Be sure to drink plenty of  fluids  PAIN MEDICATION USE AND EXPECTATIONS  You have likely been given narcotic medications to help control your pain.  After a traumatic event that results in an fracture (broken bone) with or without surgery, it is ok to use narcotic pain medications to help control one's pain.  We understand that everyone responds to pain differently and each individual patient will be evaluated on a regular basis for the continued need for narcotic medications. Ideally, narcotic medication use should last no more than 6-8 weeks (coinciding with fracture healing).   As a patient it is your responsibility as well to monitor narcotic medication use and report the amount and frequency you use these medications when you come to your office visit.   We would also advise that if you are using narcotic medications, you should take a dose prior to therapy to maximize you participation.  IF YOU ARE ON NARCOTIC MEDICATIONS IT IS NOT PERMISSIBLE TO OPERATE A MOTOR VEHICLE (MOTORCYCLE/CAR/TRUCK/MOPED) OR HEAVY MACHINERY DO NOT MIX NARCOTICS WITH OTHER CNS (CENTRAL NERVOUS SYSTEM) DEPRESSANTS SUCH AS ALCOHOL   STOP SMOKING OR USING NICOTINE PRODUCTS!!!!  As discussed nicotine severely impairs your body's ability to heal surgical and traumatic wounds but also impairs bone healing.  Wounds and bone heal by forming microscopic blood vessels (angiogenesis) and nicotine is a vasoconstrictor (essentially, shrinks blood vessels).  Therefore, if vasoconstriction occurs to these microscopic blood vessels they essentially disappear and are unable to deliver necessary nutrients to the healing  tissue.  This is one modifiable factor that you can do to dramatically increase your chances of healing your injury.    (This means no smoking, no nicotine gum, patches, etc)  DO NOT USE NONSTEROIDAL ANTI-INFLAMMATORY DRUGS (NSAID'S)  Using products such as Advil (ibuprofen), Aleve (naproxen), Motrin (ibuprofen) for additional pain control during  fracture healing can delay and/or prevent the healing response.  If you would like to take over the counter (OTC) medication, Tylenol (acetaminophen) is ok.  However, some narcotic medications that are given for pain control contain acetaminophen as well. Therefore, you should not exceed more than 4000 mg of tylenol in a day if you do not have liver disease.  Also note that there are may OTC medicines, such as cold medicines and allergy medicines that my contain tylenol as well.  If you have any questions about medications and/or interactions please ask your doctor/PA or your pharmacist.      ICE AND ELEVATE INJURED/OPERATIVE EXTREMITY  Using ice and elevating the injured extremity above your heart can help with swelling and pain control.  Icing in a pulsatile fashion, such as 20 minutes on and 20 minutes off, can be followed.    Do not place ice directly on skin. Make sure there is a barrier between to skin and the ice pack.    Using frozen items such as frozen peas works well as the conform nicely to the are that needs to be iced.  USE AN ACE WRAP OR TED HOSE FOR SWELLING CONTROL  In addition to icing and elevation, Ace wraps or TED hose are used to help limit and resolve swelling.  It is recommended to use Ace wraps or TED hose until you are informed to stop.    When using Ace Wraps start the wrapping distally (farthest away from the body) and wrap proximally (closer to the body)   Example: If you had surgery on your leg or thing and you do not have a splint on, start the ace wrap at the toes and work your way up to the thigh        If you had surgery on your upper extremity and do not have a splint on, start the ace wrap at your fingers and work your way up to the upper arm  IF YOU ARE IN A SPLINT OR CAST DO NOT REMOVE IT FOR ANY REASON   If your splint gets wet for any reason please contact the office immediately. You may shower in your splint or cast as long as you keep it dry.  This can be done  by wrapping in a cast cover or garbage back (or similar)  Do Not stick any thing down your splint or cast such as pencils, money, or hangers to try and scratch yourself with.  If you feel itchy take benadryl as prescribed on the bottle for itching  IF YOU ARE IN A CAM BOOT (BLACK BOOT)  You may remove boot periodically. Perform daily dressing changes as noted below.  Wash the liner of the boot regularly and wear a sock when wearing the boot. It is recommended that you sleep in the boot until told otherwise  CALL THE OFFICE WITH ANY QUESTIONS OR CONCERNS: [phone]

## 2017-09-10 NOTE — Progress Notes (Signed)
Orthopedic Tech Progress Note Patient Details:  Kimberly Potter [birth year] [id number] Brace completed by [employer]. Patient ID: [name], female   DOB: 1985/08/12, 32 y.o.   MRN: [id number]   [name] 09/10/2017, 5:12 PM

## 2017-09-10 NOTE — Anesthesia Procedure Notes (Signed)
Procedure Name: Intubation Date/Time: 09/10/2017 8:32 AM Performed by: Clearnce Sorrel, CRNA Pre-anesthesia Checklist: Patient identified, Emergency Drugs available, Suction available, Patient being monitored and Timeout performed Patient Re-evaluated:Patient Re-evaluated prior to induction Oxygen Delivery Method: Circle system utilized Preoxygenation: Pre-oxygenation with 100% oxygen Induction Type: IV induction Ventilation: Mask ventilation without difficulty Laryngoscope Size: Mac and 3 Grade View: Grade I Tube type: Oral Tube size: 7.0 mm Number of attempts: 1 Airway Equipment and Method: Stylet Placement Confirmation: ETT inserted through vocal cords under direct vision,  positive ETCO2 and breath sounds checked- equal and bilateral Secured at: 22 cm Tube secured with: Tape Dental Injury: Teeth and Oropharynx as per pre-operative assessment

## 2017-09-10 NOTE — Anesthesia Postprocedure Evaluation (Signed)
Anesthesia Post Note  Patient: Research scientist (life sciences)  Procedure(s) Performed: OPEN REDUCTION INTERNAL FIXATION (ORIF) TIBIAL PLATEAU (Left Knee)     Patient location during evaluation: PACU Anesthesia Type: General Level of consciousness: awake and alert Pain management: pain level controlled Vital Signs Assessment: post-procedure vital signs reviewed and stable Respiratory status: spontaneous breathing, nonlabored ventilation and respiratory function stable Cardiovascular status: blood pressure returned to baseline and stable Postop Assessment: no apparent nausea or vomiting Anesthetic complications: no    Last Vitals:  Vitals:   09/10/17 1300 09/10/17 1315  BP: 128/76   Pulse: (!) 59 (!) 56  Resp: 18 18  Temp: (!) 36.1 C   SpO2: 98% 99%    Last Pain:  Vitals:   09/10/17 1300  PainSc: Asleep                 Lowella Curb
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
## 2017-09-10 NOTE — Anesthesia Preprocedure Evaluation (Signed)
Anesthesia Evaluation  Patient identified by MRN, date of birth, ID band Patient awake    Reviewed: Allergy & Precautions, NPO status , Patient's Chart, lab work & pertinent test results  Airway Mallampati: II  TM Distance: >3 FB Neck ROM: Full    Dental no notable dental hx.    Pulmonary neg pulmonary ROS, former smoker,    Pulmonary exam normal breath sounds clear to auscultation       Cardiovascular negative cardio ROS Normal cardiovascular exam Rhythm:Regular Rate:Normal     Neuro/Psych Anxiety Depression Bipolar Disorder negative neurological ROS  negative psych ROS   GI/Hepatic negative GI ROS, Neg liver ROS,   Endo/Other  negative endocrine ROS  Renal/GU negative Renal ROS  negative genitourinary   Musculoskeletal negative musculoskeletal ROS (+)   Abdominal   Peds negative pediatric ROS (+)  Hematology negative hematology ROS (+)   Anesthesia Other Findings   Reproductive/Obstetrics negative OB ROS                             Anesthesia Physical Anesthesia Plan  ASA: II  Anesthesia Plan: General   Post-op Pain Management:    Induction: Intravenous  PONV Risk Score and Plan: 3 and Ondansetron, Dexamethasone and Midazolam  Airway Management Planned: Oral ETT  Additional Equipment:   Intra-op Plan:   Post-operative Plan: Extubation in OR  Informed Consent: I have reviewed the patients History and Physical, chart, labs and discussed the procedure including the risks, benefits and alternatives for the proposed anesthesia with the patient or authorized representative who has indicated his/her understanding and acceptance.   Dental advisory given  Plan Discussed with: CRNA  Anesthesia Plan Comments:         Anesthesia Quick Evaluation

## 2017-09-11 ENCOUNTER — Encounter (HOSPITAL_COMMUNITY): Payer: Self-pay | Admitting: Orthopedic Surgery

## 2017-09-11 DIAGNOSIS — F419 Anxiety disorder, unspecified: Secondary | ICD-10-CM | POA: Diagnosis present

## 2017-09-11 DIAGNOSIS — E559 Vitamin D deficiency, unspecified: Secondary | ICD-10-CM | POA: Diagnosis present

## 2017-09-11 DIAGNOSIS — F17200 Nicotine dependence, unspecified, uncomplicated: Secondary | ICD-10-CM | POA: Diagnosis present

## 2017-09-11 DIAGNOSIS — F319 Bipolar disorder, unspecified: Secondary | ICD-10-CM | POA: Diagnosis present

## 2017-09-11 DIAGNOSIS — F1290 Cannabis use, unspecified, uncomplicated: Secondary | ICD-10-CM | POA: Diagnosis present

## 2017-09-11 HISTORY — DX: Cannabis use, unspecified, uncomplicated: F12.90

## 2017-09-11 HISTORY — DX: Nicotine dependence, unspecified, uncomplicated: F17.200

## 2017-09-11 LAB — CALCITRIOL (1,25 DI-OH VIT D): Vit D, 1,25-Dihydroxy: 40.9 pg/mL (ref 19.9–79.3)

## 2017-09-11 LAB — VITAMIN D 25 HYDROXY (VIT D DEFICIENCY, FRACTURES): VIT D 25 HYDROXY: 24 ng/mL — AB (ref 30.0–100.0)

## 2017-09-11 MED ORDER — VITAMIN C 500 MG PO TABS
500.0000 mg | ORAL_TABLET | Freq: Every day | ORAL | Status: DC
  Administered 2017-09-11 – 2017-09-12 (×2): 500 mg via ORAL
  Filled 2017-09-11: qty 1

## 2017-09-11 MED ORDER — ACETAMINOPHEN 500 MG PO TABS: 1000.0000 mg | ORAL_TABLET | Freq: Four times a day (QID) | ORAL | 0 refills | Status: AC

## 2017-09-11 MED ORDER — METHOCARBAMOL 500 MG PO TABS: 500.0000 mg | ORAL_TABLET | Freq: Four times a day (QID) | ORAL | 0 refills | Status: AC | PRN

## 2017-09-11 MED ORDER — ASCORBIC ACID 500 MG PO TABS: 500.0000 mg | ORAL_TABLET | Freq: Every day | ORAL | 0 refills | Status: AC

## 2017-09-11 MED ORDER — VITAMIN D 1000 UNITS PO TABS
2000.0000 [IU] | ORAL_TABLET | Freq: Two times a day (BID) | ORAL | Status: DC
  Administered 2017-09-11 – 2017-09-12 (×3): 2000 [IU] via ORAL
  Filled 2017-09-11 (×3): qty 2

## 2017-09-11 MED ORDER — DOCUSATE SODIUM 100 MG PO CAPS: 100.0000 mg | ORAL_CAPSULE | Freq: Two times a day (BID) | ORAL | 0 refills | Status: AC

## 2017-09-11 MED ORDER — VITAMIN D3 125 MCG (5000 UT) PO TABS: 5000.0000 [IU] | ORAL_TABLET | Freq: Every day | ORAL | 6 refills | Status: AC

## 2017-09-11 MED ORDER — ENOXAPARIN (LOVENOX) PATIENT EDUCATION KIT: 1.0000 | PACK | Freq: Once | 0 refills | Status: AC

## 2017-09-11 MED ORDER — HYDROMORPHONE HCL 2 MG PO TABS: 2.0000 mg | ORAL_TABLET | Freq: Four times a day (QID) | ORAL | 0 refills | Status: AC | PRN

## 2017-09-11 MED ORDER — HYDROMORPHONE HCL 2 MG PO TABS
2.0000 mg | ORAL_TABLET | Freq: Four times a day (QID) | ORAL | Status: DC | PRN
  Administered 2017-09-11 – 2017-09-12 (×3): 4 mg via ORAL
  Filled 2017-09-11 (×5): qty 2

## 2017-09-11 MED ORDER — ENOXAPARIN (LOVENOX) PATIENT EDUCATION KIT
PACK | Freq: Once | Status: AC
  Administered 2017-09-11: 15:00:00
  Filled 2017-09-11: qty 1

## 2017-09-11 MED ORDER — ENOXAPARIN SODIUM 40 MG/0.4ML ~~LOC~~ SOLN: 40.0000 mg | SUBCUTANEOUS | 0 refills | Status: AC

## 2017-09-11 MED ORDER — LORATADINE 10 MG PO TABS
10.0000 mg | ORAL_TABLET | Freq: Every day | ORAL | Status: DC
  Administered 2017-09-11 – 2017-09-12 (×2): 10 mg via ORAL
  Filled 2017-09-11 (×2): qty 1

## 2017-09-11 MED ORDER — POLYETHYLENE GLYCOL 3350 17 G PO PACK: 17.0000 g | PACK | Freq: Every day | ORAL | 0 refills | Status: AC

## 2017-09-11 MED ORDER — ESCITALOPRAM OXALATE 10 MG PO TABS
20.0000 mg | ORAL_TABLET | Freq: Every day | ORAL | Status: DC
  Administered 2017-09-11 – 2017-09-12 (×2): 20 mg via ORAL
  Filled 2017-09-11 (×2): qty 2

## 2017-09-11 NOTE — Care Management Note (Addendum)
Case Management Note  Patient Details  Name: Kimberly Potter MRN: [id number] Date of Birth: 1987[place] Rec[place]/Objective:   32 yr old female s/p ORIF of left tibial plateau fracture.                 Action/Plan: Case manager called referral for Home Health to [facility], [facility]. Patient given MATCH letter..   Expected Discharge Date:     09/12/17             Expected Discharge Plan:  Home w Home Health Services  In-House Referral:  NA  Discharge planning Services  CM Consult/Medication assistance  Post Acute Care Choice:  Home Health, Durable Medical Equipment Choice offered to:  NA  DME Arranged:  [facility] rolling DME Agency:  [facility]  HH Arranged:  PT [facility] Agency:  [facility]  Status of Service:  Completed, signed off  If discussed at Long Length of Stay Meetings, dates discussed:    Additional Comments:  Durenda Guthrie, RN 09/11/2017, 1:08 PM

## 2017-09-11 NOTE — Progress Notes (Signed)
Orthopedic Trauma Service Progress Note   Patient ID: Kimberly Potter MRN: [id number] DOB/AGE: 1986/04/13 32 y.o.  Subjective:  Doing better now Severe pain earlier this morning that was uncontrollable with oral meds IV dilaudid helped and pt was able to get to chair with therapy  No other complaints  Feels one more night is all she needs   Appetite is good  + void   Review of Systems  Constitutional: Negative for chills and fever.  Respiratory: Negative for shortness of breath and wheezing.   Cardiovascular: Negative for chest pain and palpitations.  Gastrointestinal: Negative for nausea and vomiting.  Neurological: Negative for tingling and tremors.    Objective:   VITALS:   Vitals:   09/10/17 1515 09/10/17 1540 09/10/17 2126 09/11/17 0500  BP:  114/66 133/86 109/72  Pulse: 66 72 (!) 108 75  Resp: (!) Temp:  98.7 F (37.1 C) 99 F (37.2 C) 98.4 F (36.9 C)  TempSrc:  Oral Oral Oral  SpO2: 96% 95% 94% 94%    Estimated body mass index is 38.16 kg/m as calculated from the following:   Height as of 05/29/16:  (1.727 m).   Weight as of 05/29/16: 113.9 kg (251 lb).   Intake/Output      05/16 0701 - 05/17 0700 05/17 0701 - 05/18 0700   P.O. 360    I.V. 1600    Total Intake 1960    Urine 650    Blood 250    Total Output 900    Net +1060         Urine Occurrence 2 x      LABS  Results for Kimberly Potter (MRN [id number]) as of 09/11/2017 11:53  Ref. Range 09/10/2017 09:38  Vitamin D, 25-Hydroxy Latest Ref Range: 30.0 - 100.0 ng/mL 24.0 (L)    PHYSICAL EXAM:    Gen: awake and alert, appears comfortable sitting in bed, NAD  Lungs: breathing unlabored Cardiac: Regular  Ext:       Left Lower Extremity   Hinged knee brace fitting well  Dressing c/d/i  Ext warm  Compartments are soft, no pain with passive stretch   DPN, SPN, TN sensation intact  EHL, FHL, AT, PT, peroneals, gastroc motor intact  Good active and  passive ankle ROM  No DCT  Swelling well controlled     Assessment/Plan: 1 Day Post-Op   Active Problems:   Closed fracture of left tibial plateau   Bipolar 1 disorder (HCC)   Anxiety   Anti-infectives (From admission, onward)   Start     Dose/Rate Route Frequency Ordered Stop   09/10/17 1630  ceFAZolin (ANCEF) IVPB 1 g/50 mL premix     1 g 100 mL/hr over 30 Minutes Intravenous Every 6 hours 09/10/17 1537 09/11/17 0535   09/10/17 0600  ceFAZolin (ANCEF) IVPB 2g/100 mL premix     2 g 200 mL/hr over 30 Minutes Intravenous On call to O.R. 09/10/17 7829 09/10/17 0835    .  POD/HD#: 1   32 y/o female s/p ORIF L tibial plateau fracture   - L tibial plateau fracture s/p ORIF  NWB x 6 weeks  Unrestricted ROM L knee, hinged brace for comfort  PT and OT evals  Ice prn   Elevate leg for swelling control, elevate above heart when possible  Dressing change tomorrow     HEP for Left knee ROM- AROM, PROM. Prone exercises as well. No ROM restrictions. [employer], SLR, LAQ, SAQ,  heel slides, stretching, prone flexion and extension    Ankle theraband program, heel cord stretching, toe towel curls, etc    No pillows under bend of knee when at rest, ok to place under heel to help work on extension. Can also use zero knee bone foam if available  - Pain management:  Will change oral regimen    Dilaudid 2 mg 1-2 po q6h prn severe pain    Tylenol 1000 mg po q6h scheduled   Robaxin 1000 mg po q6h scheduled   - ABL anemia/Hemodynamics  Vitals stable  - Medical issues   Bipolar    Home meds    - DVT/PE prophylaxis:  lovenox x 21 days post op    Will see if pt qualifies for match program    If unable to get lovenox will do ASA 325 mg po q12h x 4 weeks but this is not nearly as effective   - ID:   periop abx to be completed today  - Metabolic Bone Disease:  Vitamin d insufficiency    Vitamin d supplementation    Vitamin C to assist with collagen synthesis   - Activity:  NWB  L leg   OOB as tolerated   - FEN/GI prophylaxis/Foley/Lines:  Reg diet  KVO IVF  - Impediments to fracture healing:  Drug use   - Dispo:  Continue with therapies   Probable dc home tomorrow  Optimize PO pain control     Mearl Latin, PA-C [facility] [phone] (P) [phone] Traci Sermon (C) 09/11/2017, 11:49 AM

## 2017-09-11 NOTE — Evaluation (Signed)
Occupational Therapy Evaluation Patient Details Name: Kimberly Potter MRN: [id number] DOB: 05-Feb-1986 Today's Date: 09/11/2017    History of Present Illness 32 y.o. female  s/p OPEN REDUCTION INTERNAL FIXATION (ORIF) TIBIAL PLATEAU (Left Knee)   Clinical Impression   Pt admitted with the above diagnoses and presents with below problem list. Pt will benefit from continued acute OT to address the below listed deficits and maximize independence with basic ADLs prior to d/c home. At baseline pt is independent with ADLs. Limited OT eval this session due to 10/10 LLE pain with pt crying and shouting at times. Ultimately declined full bed mobility to EOB position but with assistance brought LLE across the bed. Nursing notified of pt's request for pain med. Anticipate once pain level is in a more tolerable range pt will progress well with therapy.       Follow Up Recommendations  Supervision/Assistance - 24 hour    Equipment Recommendations  3 in 1 bedside commode    Recommendations for Other Services       Precautions / Restrictions Precautions Precautions: Fall Precaution Comments: no h/o falls Required Braces or Orthoses: Other Brace/Splint Other Brace/Splint: L knee bledsoe brace Restrictions Weight Bearing Restrictions: Yes LLE Weight Bearing: Non weight bearing      Mobility Bed Mobility Overal bed mobility: Needs Assistance Bed Mobility: Supine to Sit     Supine to sit: HOB elevated;Min guard     General bed mobility comments: with assist brought LLE right up to EOB but ultimately declined full EOB position 2/2 pain  Transfers Overall transfer level: Needs assistance Equipment used: Rolling walker (2 wheeled) Transfers: Sit to/from [employer] Sit to Stand: Min guard Stand pivot transfers: Min guard       General transfer comment: min/guard for safety, VCs hand placement, SPT x 3 bed to recliner to 3 in 1 to recliner    Balance Overall balance  assessment: Needs assistance   Sitting balance-Leahy Scale: Good       Standing balance-Leahy Scale: Fair                             ADL either performed or assessed with clinical judgement   ADL Overall ADL's : Needs assistance/impaired Eating/Feeding: Set up;Bed level   Grooming: Set up;Bed level   Upper Body Bathing: Set up;Bed level   Lower Body Bathing: Bed level;Maximal assistance   Upper Body Dressing : Set up;Bed level   Lower Body Dressing: Maximal assistance;Bed level                 General ADL Comments: Pt completed partial bed mobility. Session ended 2/2 10/10 pain with pt crying, shouting at times, declining full EOB. Nursing notified.      Vision         Perception     Praxis      Pertinent Vitals/Pain Pain Assessment: 0-10 Pain Score: 10-Worst pain ever Pain Location: LLE Pain Descriptors / Indicators: Sore Pain Intervention(s): Limited activity within patient's tolerance;Monitored during session;Repositioned;Utilized relaxation techniques;Patient requesting pain meds-RN notified     Hand Dominance     Extremity/Trunk Assessment Upper Extremity Assessment Upper Extremity Assessment: Overall WFL for tasks assessed   Lower Extremity Assessment Lower Extremity Assessment: Defer to PT evaluation LLE Deficits / Details: can wiggle toes and actively DF/PF ankle within 50% of full ROM, limited by pain, does not tolerate passive movement of knee/hip (moving LEs of bed into flat position  greatly raised her pain level) LLE: Unable to fully assess due to pain LLE Sensation: WNL   Cervical / Trunk Assessment Cervical / Trunk Assessment: Normal   Communication Communication Communication: No difficulties   Cognition Arousal/Alertness: Awake/alert Behavior During Therapy: Anxious Overall Cognitive Status: Within Functional Limits for tasks assessed                                 General Comments: crying, shouting  at times 2/2 pain level   General Comments       Exercises General Exercises - Lower Extremity Ankle Circles/Pumps: AROM;Left;10 reps Quad Sets: (instructed on RLE, pt refused to attempt on LLE 2* severe pain) Heel Slides: (pt refused to attempt)   Shoulder Instructions      Home Living Family/patient expects to be discharged to:: Private residence Living Arrangements: Parent Available Help at Discharge: Family   Home Access: Stairs to enter Secretary/administrator of Steps: 1   Home Layout: One level     Bathroom Shower/Tub: Producer, television/film/video: Handicapped height     Home Equipment: Environmental consultant - 2 wheels;Bedside commode;Shower seat;Grab bars - tub/shower;Grab bars - toilet          Prior Functioning/Environment Level of Independence: Independent        Comments: prior to fx was independent, moved in with parents after fx,  since fx a week ago pt has used RW at home and has been NWB LLE, father has been lifting her up 1 STE        OT Problem List: Impaired balance (sitting and/or standing);Decreased knowledge of use of DME or AE;Decreased knowledge of precautions;Pain      OT Treatment/Interventions: Self-care/ADL training;DME and/or AE instruction;Therapeutic activities;Patient/family education;Balance training    OT Goals(Current goals can be found in the care plan section) Acute Rehab OT Goals Patient Stated Goal: be able to walk, be able to work out at gym OT Goal Formulation: With patient Time For Goal Achievement: 09/18/17 Potential to Achieve Goals: Good ADL Goals Pt Will Perform Lower Body Bathing: with min guard assist;sit to/from stand Pt Will Perform Lower Body Dressing: with min guard assist;sit to/from stand Pt Will Transfer to Toilet: with min guard assist;ambulating Pt Will Perform Toileting - Clothing Manipulation and hygiene: with min guard assist;sit to/from stand Pt Will Perform Tub/Shower Transfer: with min guard  assist;ambulating;3 in 1;rolling walker Additional ADL Goal #1: Pt will complete bed mobility at mod I level to prepare for OOB ADLs.  OT Frequency: Min 3X/week   Barriers to D/C:            Co-evaluation PT/OT/SLP Co-Evaluation/Treatment: Yes Reason for Co-Treatment: Complexity of the patient's impairments (multi-system involvement);For patient/therapist safety;To address functional/ADL transfers PT goals addressed during session: Mobility/safety with mobility;Strengthening/ROM OT goals addressed during session: ADL's and self-care      AM-PAC PT "6 Clicks" Daily Activity     Outcome Measure Help from another person eating meals?: None Help from another person taking care of personal grooming?: None Help from another person toileting, which includes using toliet, bedpan, or urinal?: A Lot Help from another person bathing (including washing, rinsing, drying)?: A Lot Help from another person to put on and taking off regular upper body clothing?: None Help from another person to put on and taking off regular lower body clothing?: A Lot 6 Click Score: 18   End of Session Nurse Communication: Patient requests pain meds  Activity Tolerance: Patient limited by pain Patient left: in bed;with call bell/phone within reach;with family/visitor present  OT Visit Diagnosis: Unsteadiness on feet (R26.81);Pain Pain - Right/Left: Left Pain - part of body: Leg                Time: 6045-4098 OT Time Calculation (min): 10 min Charges:  OT General Charges $OT Visit: 1 Visit OT Evaluation $OT Eval Low Complexity: 1 Low G-Codes:       Pilar Grammes 09/11/2017, 1:29 PM

## 2017-09-11 NOTE — Plan of Care (Signed)
  Problem: Education: Goal: Knowledge of General Education information will improve Outcome: Progressing   

## 2017-09-11 NOTE — Care Management (Signed)
MATCH letter ( medication assistance) given and explained . Spoke to [place] in pharmacy [facility] will cover Lovenox 40 mg , 21 syringes. Also entered over ride for Dilaudid 2 mg tab.  Patient voiced understanding.  Ronny Flurry RN BSN [phone]

## 2017-09-11 NOTE — Evaluation (Signed)
Physical Therapy Evaluation Patient Details Name: Kimberly Potter MRN: [id number] DOB: 03-14-1986 Today's Date: 09/11/2017   History of Present Illness  32 y.o. female  s/p OPEN REDUCTION INTERNAL FIXATION (ORIF) TIBIAL PLATEAU (Left Knee)  Clinical Impression  Pt admitted with above diagnosis. Pt currently with functional limitations due to the deficits listed below (see PT Problem List). Pt crying and reporting severe L knee pain with minimal movement, she was unable to tolerate supine to sit 2* pain. Pt advanced LLE to edge of bed then refused further mobility 2* pain. Attempted to instruct pt in HEP, however she could only tolerate ankle pumps 2* pain. Requested IV diluadid and will re-attempt mobility after it takes effect.  Pt will benefit from skilled PT to increase their independence and safety with mobility to allow discharge to the venue listed below.       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  A walker alone will not resolve the issues with performing activities of daily living. A wheelchair will allow patient to safely perform daily activities.  The patient can self propel in the home or has a caregiver who can provide assistance.       Recommendations for Other Services       Precautions / Restrictions Precautions Precautions: Fall Precaution Comments: no h/o falls Required Braces or Orthoses: Other Brace/Splint Other Brace/Splint: L knee bledsoe brace Restrictions Weight Bearing Restrictions: Yes LLE Weight Bearing: Non weight bearing      Mobility  Bed Mobility Overal bed mobility: Needs Assistance Bed Mobility: Supine to Sit     Supine to sit: Mod assist;HOB elevated     General bed mobility comments: attempted  supine to sit, pt tolerated advancing LLE partially to edge of bed but could not tolerate bringing it over edge of bed, pt refused further mobility 2* severe pain, phoned RN and requested IV pain medication  Transfers                 General transfer comment: unable 2* pain  Ambulation/Gait             General Gait Details: unable 2* pain  Stairs            Wheelchair Mobility    Modified Rankin (Stroke Patients Only)       Balance                                             Pertinent Vitals/Pain Pain Assessment: 0-10 Pain Score: 8  Pain Location: LLE Pain Intervention(s): Monitored during session;Premedicated before session;Limited activity within patient's tolerance;Patient requesting pain meds-RN notified(pt refused ice)    Home Living Family/patient expects to be discharged to:: Private residence Living Arrangements: Parent Available Help at Discharge: Family   Home Access: Stairs to enter   Secretary/administrator of Steps: 1 Home Layout: One level Home Equipment: Environmental consultant - 2 wheels;Bedside commode;Shower seat;Grab bars - tub/shower;Grab bars - toilet      Prior Function           Comments: prior to fx was independent, moved in with parents after fx,  since fx a week ago pt has used RW at  home and has been NWB LLE, father has been lifting her up 1 STE     Hand Dominance        Extremity/Trunk Assessment   Upper Extremity Assessment Upper Extremity Assessment: Defer to OT evaluation    Lower Extremity Assessment Lower Extremity Assessment: LLE deficits/detail LLE Deficits / Details: can wiggle toes and actively DF/PF ankle within 50% of full ROM, limited by pain, does not tolerate passive movement of knee/hip (moving LEs of bed into flat position greatly raised her pain level) LLE: Unable to fully assess due to pain LLE Sensation: WNL    Cervical / Trunk Assessment Cervical / Trunk Assessment: Normal   Communication   Communication: No difficulties  Cognition Arousal/Alertness: Awake/alert Behavior During Therapy: WFL for tasks assessed/performed Overall Cognitive Status: Within Functional Limits for tasks assessed                                        General Comments      Exercises General Exercises - Lower Extremity Ankle Circles/Pumps: AROM;Left;10 reps Quad Sets: (instructed on RLE, pt refused to attempt on LLE 2* severe pain) Heel Slides: (pt refused to attempt)   Assessment/Plan    PT Assessment Patient needs continued PT services  PT Problem List Decreased strength;Decreased range of motion;Decreased activity tolerance;Pain;Decreased mobility       PT Treatment Interventions Gait training;DME instruction;Stair training;Therapeutic activities;Functional mobility training;Therapeutic exercise;Patient/family education    PT Goals (Current goals can be found in the Care Plan section)  Acute Rehab PT Goals Patient Stated Goal: be able to walk, be able to work out at gym PT Goal Formulation: With patient/family Time For Goal Achievement: 09/25/17 Potential to Achieve Goals: Fair    Frequency Min 6X/week   Barriers to discharge        Co-evaluation PT/OT/SLP Co-Evaluation/Treatment: Yes Reason for Co-Treatment: Complexity of the patient's impairments (multi-system involvement);For patient/therapist safety;To address functional/ADL transfers PT goals addressed during session: Mobility/safety with mobility;Strengthening/ROM         AM-PAC PT "6 Clicks" Daily Activity  Outcome Measure Difficulty turning over in bed (including adjusting bedclothes, sheets and blankets)?: Unable Difficulty moving from lying on back to sitting on the side of the bed? : Unable Difficulty sitting down on and standing up from a chair with arms (e.g., wheelchair, bedside commode, etc,.)?: Unable Help needed moving to and from a bed to chair (including a wheelchair)?:  Total Help needed walking in hospital room?: Total Help needed climbing 3-5 steps with a railing? : Total 6 Click Score: 6    End of Session Equipment Utilized During Treatment: Other (comment)(LLE Bledsoe brace) Activity Tolerance: Patient limited by pain Patient left: in bed;with call bell/phone within reach;with family/visitor present;with nursing/sitter in room Nurse Communication: Mobility status;Patient requests pain meds PT Visit Diagnosis: Difficulty in walking, not elsewhere classified (R26.2);Muscle weakness (generalized) (M62.81);Pain Pain - Right/Left: Left Pain - part of body: Knee    Time: 0865-7846 PT Time Calculation (min) (ACUTE ONLY): 39 min   Charges:         PT G Codes:        Ralene Bathe Kistler 09/11/2017, 11:04 AM [phone]

## 2017-09-11 NOTE — Progress Notes (Signed)
Physical Therapy Treatment Patient Details Name: Kimberly Potter MRN: [id number] DOB: [birth year] Today's Date: 09/11/2017    History of Present Illness 32 y.o. female  s/p OPEN REDUCTION INTERNAL FIXATION (ORIF) TIBIAL PLATEAU (Left Knee)    PT Comments    Pt reports good response to dilaudid, able to perform stand pivot transfer x 3 with RW and min/guard assist.    Follow Up Recommendations  Home health PT     Equipment Recommendations  Rolling walker with 5" wheels    Recommendations for Other Services       Precautions / Restrictions Precautions Precautions: Fall Precaution Comments: no h/o falls Required Braces or Orthoses: Other Brace/Splint Other Brace/Splint: L knee bledsoe brace Restrictions Weight Bearing Restrictions: Yes LLE Weight Bearing: Non weight bearing    Mobility  Bed Mobility Overal bed mobility: Needs Assistance Bed Mobility: Supine to Sit     Supine to sit: HOB elevated;Min guard     General bed mobility comments: min/guard for LLE  Transfers Overall transfer level: Needs assistance Equipment used: Rolling walker (2 wheeled) Transfers: Sit to/from [employer] Sit to Stand: Min guard Stand pivot transfers: Min guard       General transfer comment: min/guard for safety, VCs hand placement, SPT x 3 bed to recliner to 3 in 1 to recliner  Ambulation/Gait             General Gait Details: unable 2* pain   Stairs             Wheelchair Mobility    Modified Rankin (Stroke Patients Only)       Balance Overall balance assessment: Needs assistance   Sitting balance-Leahy Scale: Good       Standing balance-Leahy Scale: Fair                              Cognition Arousal/Alertness: Awake/alert Behavior During Therapy: WFL for tasks assessed/performed Overall Cognitive Status: Within Functional Limits for tasks assessed                                        Exercises  General Exercises - Lower Extremity Ankle Circles/Pumps: AROM;Left;10 reps Quad Sets: (instructed on RLE, pt refused to attempt on LLE 2* severe pain) Heel Slides: (pt refused to attempt)    General Comments        Pertinent Vitals/Pain Pain Assessment: 0-10 Pain Score: 4  Pain Location: LLE Pain Descriptors / Indicators: Sore Pain Intervention(s): Limited activity within patient's tolerance;Monitored during session;Premedicated before session    Home Living Family/patient expects to be discharged to:: Private residence Living Arrangements: Parent Available Help at Discharge: Family   Home Access: Stairs to enter   Home Layout: One level Home Equipment: Environmental consultant - 2 wheels;Bedside commode;Shower seat;Grab bars - tub/shower;Grab bars - toilet      Prior Function        Comments: prior to fx was independent, moved in with parents after fx,  since fx a week ago pt has used RW at home and has been NWB LLE, father has been lifting her up 1 STE   PT Goals (current goals can now be found in the care plan section) Acute Rehab PT Goals Patient Stated Goal: be able to walk, be able to work out at gym PT Goal Formulation: With patient/family Time For Goal Achievement: 09/25/17 Potential  to Achieve Goals: Fair Progress towards PT goals: Progressing toward goals    Frequency    Min 6X/week      PT Plan Current plan remains appropriate    Co-evaluation PT/OT/SLP Co-Evaluation/Treatment: Yes Reason for Co-Treatment: Complexity of the patient's impairments (multi-system involvement);For patient/therapist safety;To address functional/ADL transfers PT goals addressed during session: Mobility/safety with mobility;Strengthening/ROM        AM-PAC PT "6 Clicks" Daily Activity  Outcome Measure  Difficulty turning over in bed (including adjusting bedclothes, sheets and blankets)?: Unable Difficulty moving from lying on back to sitting on the side of the bed? : A Little Difficulty  sitting down on and standing up from a chair with arms (e.g., wheelchair, bedside commode, etc,.)?: A Little Help needed moving to and from a bed to chair (including a wheelchair)?: A Little Help needed walking in hospital room?: A Lot Help needed climbing 3-5 steps with a railing? : Total 6 Click Score: 13    End of Session Equipment Utilized During Treatment: Other (comment)(LLE Bledsoe brace) Activity Tolerance: Patient limited by pain Patient left: with call bell/phone within reach;with family/visitor present;in chair Nurse Communication: Mobility status;Patient requests pain meds PT Visit Diagnosis: Difficulty in walking, not elsewhere classified (R26.2);Muscle weakness (generalized) (M62.81);Pain Pain - Right/Left: Left Pain - part of body: Knee     Time: 2130-8657 PT Time Calculation (min) (ACUTE ONLY): 15 min  Charges:  $Therapeutic Activity: 8-22 mins                    G Codes:          Tamala Ser 09/11/2017, 11:49 AM [phone]

## 2017-09-12 NOTE — Progress Notes (Signed)
Physical Therapy Treatment Patient Details Name: Kimberly Potter MRN: [id number] DOB: 1985/12/18 Today's Date: 09/12/2017    History of Present Illness 32 y.o. female  s/p OPEN REDUCTION INTERNAL FIXATION (ORIF) TIBIAL PLATEAU (Left Knee)    PT Comments    Patient is making progress toward PT goals and able transfer and ambulate short distance with min guard A for safety. Pt premedicated with IV dilaudid prior to mobilizing per pt's request. Mother present during session. Current plan remains appropriate.    Follow Up Recommendations  Home health PT     Equipment Recommendations  Wheelchair (measurements PT);Wheelchair cushion (measurements PT);Other (comment);3in1 (PT)(elevating leg rests; pt has RW in room)    Recommendations for Other Services       Precautions / Restrictions Precautions Precautions: Fall Precaution Comments: no h/o falls Required Braces or Orthoses: Other Brace/Splint Other Brace/Splint: L knee bledsoe brace; no ROM restrictions Restrictions Weight Bearing Restrictions: Yes LLE Weight Bearing: Non weight bearing    Mobility  Bed Mobility Overal bed mobility: Modified Independent Bed Mobility: Supine to Sit           General bed mobility comments: pt assisted L LE to EOB with HOB flat and no use of rail; increased time and effort needed   Transfers Overall transfer level: Needs assistance Equipment used: Rolling walker (2 wheeled) Transfers: Sit to/from Stand Sit to Stand: Min guard         General transfer comment: min guard for safety; pt able to stand and maintain NWB L LE  Ambulation/Gait Ambulation/Gait assistance: Min guard Ambulation Distance (Feet): 50 Feet Assistive device: Rolling walker (2 wheeled) Gait Pattern/deviations: Step-to pattern Gait velocity: decreased   General Gait Details: pt with safe use of AD and able to maintain NWB L LE throughout; increased time due to bilat UE fatigue; R shoe donned prior to  ambulating   Stairs         General stair comments: pt and mother given handout and educated on goind up steps in w/c; mother familiar with process    Wheelchair Mobility    Modified Rankin (Stroke Patients Only)       Balance Overall balance assessment: Needs assistance   Sitting balance-Leahy Scale: Good     Standing balance support: Bilateral upper extremity supported;During functional activity Standing balance-Leahy Scale: Fair                              Cognition Arousal/Alertness: Awake/alert Behavior During Therapy: Flat affect Overall Cognitive Status: Within Functional Limits for tasks assessed                                        Exercises General Exercises - Lower Extremity Ankle Circles/Pumps: AROM;Left;10 reps    General Comments General comments (skin integrity, edema, etc.): pt educated on positioning of L LE for optimal healing       Pertinent Vitals/Pain Pain Assessment: Faces Faces Pain Scale: Hurts little more Pain Location: LLE Pain Descriptors / Indicators: Sore Pain Intervention(s): Limited activity within patient's tolerance;Monitored during session;Premedicated before session;Repositioned    Home Living                      Prior Function            PT Goals (current goals can now be found in the care  plan section) Progress towards PT goals: Progressing toward goals    Frequency    Min 6X/week      PT Plan Current plan remains appropriate    Co-evaluation              AM-PAC PT "6 Clicks" Daily Activity  Outcome Measure  Difficulty turning over in bed (including adjusting bedclothes, sheets and blankets)?: Unable Difficulty moving from lying on back to sitting on the side of the bed? : A Little Difficulty sitting down on and standing up from a chair with arms (e.g., wheelchair, bedside commode, etc,.)?: A Little Help needed moving to and from a bed to chair (including a  wheelchair)?: A Little Help needed walking in hospital room?: A Little Help needed climbing 3-5 steps with a railing? : Total 6 Click Score: 14    End of Session Equipment Utilized During Treatment: Gait belt Activity Tolerance: Patient tolerated treatment well Patient left: in chair;with call bell/phone within reach;with family/visitor present Nurse Communication: Mobility status PT Visit Diagnosis: Difficulty in walking, not elsewhere classified (R26.2);Muscle weakness (generalized) (M62.81);Pain Pain - Right/Left: Left Pain - part of body: Knee     Time: 1000-1031 PT Time Calculation (min) (ACUTE ONLY): 31 min  Charges:  $Gait Training: 8-22 mins $Therapeutic Activity: 8-22 mins                    G Codes:       Erline Levine, PTA Pager: [phone]     Carolynne Edouard 09/12/2017, 11:00 AM

## 2017-09-12 NOTE — Progress Notes (Addendum)
PT rec for WC. Bedside RN is in process of obtaining WC order from MD, paged awaiting callback. Notified Jermaine w [facility] of need for WC and DC today. Patient aware she may have to return the RW as two devices to assist w ambulating cannot be provided concurrently. She is agreeable, her mother states they have RW at home already.  11:10 Spoke w [facility], they state patient can keep RW as well as wheelchair. Verified that WC order has to be written by MD, as explained to bedside nurse.  12:45 Bedside nurse stated she put in order because MD was in surgery. Explained to her again that it must be done by MD. Received call back from charge nurse who stated that MD said he "does not know how to put in order." Spoke w [facility] requesting them to make exception and fullfill order as is so patient can be discharged and to include 3/1.

## 2017-09-12 NOTE — Progress Notes (Signed)
OT Cancellation Note  Patient Details Name: Kimberly Potter MRN: [id number] DOB: 1985/08/26   Cancelled Treatment:    Reason Eval/Treat Not Completed: Other (comment).  Attempted skilled OT tx session. Pt. And mother decline stating that they have a well est. Routine for toilet/tub transfers and did not need physical review.  Pt. Also states she was currently pain free and did not want to move LE.  Both report they are prepared for d/c home and had no further questions.   Robet Leu, COTA/L 09/12/2017, 1:12 PM

## 2017-09-12 NOTE — Plan of Care (Signed)
  Problem: Clinical Measurements: Goal: Will remain free from infection Outcome: Progressing   Problem: Pain Managment: Goal: General experience of comfort will improve Outcome: Progressing   

## 2017-09-12 NOTE — Discharge Summary (Signed)
Discharge Summary  Patient ID: Kimberly Potter MRN: [id number] DOB/AGE: April 01, 1986 32 y.o.  Admit date: 09/10/2017 Discharge date: 09/12/2017  Admission Diagnoses:  Closed fracture of left tibial plateau  Discharge Diagnoses:  Principal Problem:   Closed fracture of left tibial plateau Active Problems:   Bipolar 1 disorder (HCC)   Anxiety   Nicotine dependence   Marijuana use   Vitamin D insufficiency   Past Medical History:  Diagnosis Date  . Anxiety   . Bipolar 1 disorder ([place])   . Depression   . Marijuana use 09/11/2017  . Nicotine dependence 09/11/2017    Surgeries: Procedure(s): OPEN REDUCTION INTERNAL FIXATION (ORIF) TIBIAL PLATEAU on 09/10/2017   Consultants (if any):   Discharged Condition: Improved  Hospital Course: Kimberly Potter is an 32 y.o. female who was admitted 09/10/2017 with a diagnosis of Closed fracture of left tibial plateau and went to the operating room on 09/10/2017 and underwent the above named procedures.    She was given perioperative antibiotics:  Anti-infectives (From admission, onward)   Start     Dose/Rate Route Frequency Ordered Stop   09/10/17 1630  ceFAZolin (ANCEF) IVPB 1 g/50 mL premix     1 g 100 mL/hr over 30 Minutes Intravenous Every 6 hours 09/10/17 1537 09/11/17 0535   09/10/17 0600  ceFAZolin (ANCEF) IVPB 2g/100 mL premix     2 g 200 mL/hr over 30 Minutes Intravenous On call to O.R. 09/10/17 9024 09/10/17 0835    .  She was given sequential compression devices, early ambulation, and Lovenox for DVT prophylaxis.  She benefited maximally from the hospital stay and there were no complications.    Recent vital signs:  Vitals:   09/12/17 0500 09/12/17 1421  BP: 116/61 123/62  Pulse: 71 92  Resp:  18  Temp: 98.7 F (37.1 C) 98.9 F (37.2 C)  SpO2: 98% 97%    Recent laboratory studies:  Lab Results  Component Value Date   HGB 11.4 (L) 09/10/2017   HGB 12.6 09/10/2017   Lab Results  Component Value Date   WBC 8.1  09/10/2017   PLT 220 09/10/2017   Lab Results  Component Value Date   INR 0.98 09/10/2017   Lab Results  Component Value Date   NA 139 09/10/2017   K 3.5 09/10/2017   CL 104 09/10/2017   CO2 26 09/10/2017   BUN 8 09/10/2017   CREATININE 0.61 09/10/2017   GLUCOSE 105 (H) 09/10/2017    Discharge Medications:   Allergies as of 09/12/2017   No Known Allergies     Medication List    STOP taking these medications   HYDROcodone-acetaminophen 5-325 MG tablet Commonly known as:  NORCO/VICODIN   ibuprofen 600 MG tablet Commonly known as:  ADVIL,MOTRIN     TAKE these medications   acetaminophen 500 MG tablet Commonly known as:  TYLENOL Take 2 tablets (1,000 mg total) by mouth every 6 (six) hours.   ascorbic acid 500 MG tablet Commonly known as:  VITAMIN C Take 1 tablet (500 mg total) by mouth daily.   cetirizine 10 MG tablet Commonly known as:  ZYRTEC Take 10 mg by mouth daily.   docusate sodium 100 MG capsule Commonly known as:  COLACE Take 1 capsule (100 mg total) by mouth 2 (two) times daily.   enoxaparin 40 MG/0.4ML injection Commonly known as:  LOVENOX Inject 0.4 mLs (40 mg total) into the skin daily.   escitalopram 20 MG tablet Commonly known as:  LEXAPRO Take 1 tablet (  20 mg total) by mouth daily.   HYDROmorphone 2 MG tablet Commonly known as:  DILAUDID Take 1-2 tablets (2-4 mg total) by mouth every 6 (six) hours as needed for up to 7 days for moderate pain or severe pain.   methocarbamol 500 MG tablet Commonly known as:  ROBAXIN Take 1-2 tablets (500-1,000 mg total) by mouth every 6 (six) hours as needed for muscle spasms.   polyethylene glycol packet Commonly known as:  MIRALAX / GLYCOLAX Take 17 g by mouth daily.   Vitamin D3 5000 units Tabs Take 1 tablet (5,000 Units total) by mouth daily.     ASK your doctor about these medications   enoxaparin Kit Commonly known as:  LOVENOX 1 kit by Does not apply route once for 1 dose. Ask about: Should  I take this medication?            Durable Medical Equipment  (From admission, onward)        Start     Ordered   09/12/17 1250  For home use only DME Bedside commode  Once    Question:  Patient needs a bedside commode to treat with the following condition  Answer:  Immobility   09/12/17 1250   09/12/17 1211  For home use only DME standard manual wheelchair with seat cushion  Once    Comments:  Patient suffers from tibial plateau which impairs their ability to perform daily activities like ambulate in the home.  A walker will not resolve issue with performing activities of daily living. A wheelchair will allow patient to safely perform daily activities. Patient can safely propel the wheelchair in the home or has a caregiver who can provide assistance.  Accessories: elevating leg rests (ELRs), wheel locks, extensions and anti-tippers.   09/12/17 1211      Diagnostic Studies: Dg Tibia/fibula Left  Result Date: 09/10/2017 CLINICAL DATA:  ORIF of left tibial plateau fracture. EXAM: DG C-ARM 61-120 MIN; LEFT TIBIA AND FIBULA - 2 VIEW COMPARISON:  Left knee radiographs and CT 09/01/2017 FLUOROSCOPY TIME:  C-arm fluoroscopic images were obtained intraoperatively and submitted for post operative interpretation. Please see the performing provider's procedural report for the fluoroscopy time utilized. FINDINGS: Nine intraoperative fluoroscopic images of the left knee are provided and demonstrate ORIF of the lateral tibial plateau fracture with placement of a lateral plate and screws. Alignment after fixation is grossly anatomic on these limited fluoroscopic images. IMPRESSION: Intraoperative images during ORIF of left tibial plateau fracture. Electronically Signed   By: Logan Bores M.D.   On: 09/10/2017 12:17   Ct Knee Left Wo Contrast  Result Date: 09/01/2017 CLINICAL DATA:  Left knee pain after inner tubing today. EXAM: CT OF THE LEFT KNEE WITHOUT CONTRAST TECHNIQUE: Multidetector CT imaging of  the LEFT knee was performed according to the standard protocol. Multiplanar CT image reconstructions were also generated. COMPARISON:  09/01/2017 radiographs FINDINGS: Bones/Joint/Cartilage Schatzker category type 2 split-depressed lateral tibial plateau fracture is identified with approximately 4 mm of depression of a tibial plateau fragment measuring 19 x 14 x 13 mm in AP by transverse by craniocaudad dimension. Fracture involves the posterior base of the lateral tibial spine as well. There is a moderate to large lipohemarthrosis in the suprapatellar compartment with a small focus of air adjacent to the medial facet of the patella. Ligaments Suboptimally assessed by CT. Muscles and Tendons Intact quadriceps and patellar tendons. No muscle atrophy nor intramuscular hemorrhage. Soft tissues Negative IMPRESSION: Schatzker category type 2 split-depressed lateral  tibial plateau fracture with 4 mm of depression of a lateral tibial plateau fragment that measures 19 x 14 x 13 mm. Associated moderate to large lipohemarthrosis with a small focus of intra-articular gas also noted. Electronically Signed   By: Ashley Royalty M.D.   On: 09/01/2017 23:11   Dg Knee Complete 4 Views Left  Result Date: 09/01/2017 CLINICAL DATA:  Left knee pain after tubing on river. EXAM: LEFT KNEE - COMPLETE 4+ VIEW COMPARISON:  None. FINDINGS: Mildly displaced longitudinal lateral tibial plateau fracture is noted with intra-articular extension. Fat fluid level is noted in the anterior joint space. The femur and fibula are unremarkable. IMPRESSION: Mildly displaced lateral tibial plateau fracture with intra-articular extension and longitudinal orientation. Electronically Signed   By: Marijo Conception, M.D.   On: 09/01/2017 21:59   Dg Knee Left Port  Result Date: 09/10/2017 CLINICAL DATA:  Tibia ORIF. EXAM: PORTABLE LEFT KNEE - 1-2 VIEW COMPARISON:  Left knee x-rays dated Sep 01, 2017. FINDINGS: Postsurgical changes related to lateral plate and  screw fixation of the proximal tibia. No evidence of hardware failure or loosening. Small joint effusion with intra-articular air, likely postsurgical. Joint spaces are preserved. Bone mineralization is normal. Soft tissues are unremarkable. IMPRESSION: Proximal tibia ORIF without evidence of hardware complication. Electronically Signed   By: Titus Dubin M.D.   On: 09/10/2017 13:04   Dg C-arm 1-60 Min  Result Date: 09/10/2017 CLINICAL DATA:  ORIF of left tibial plateau fracture. EXAM: DG C-ARM 61-120 MIN; LEFT TIBIA AND FIBULA - 2 VIEW COMPARISON:  Left knee radiographs and CT 09/01/2017 FLUOROSCOPY TIME:  C-arm fluoroscopic images were obtained intraoperatively and submitted for post operative interpretation. Please see the performing provider's procedural report for the fluoroscopy time utilized. FINDINGS: Nine intraoperative fluoroscopic images of the left knee are provided and demonstrate ORIF of the lateral tibial plateau fracture with placement of a lateral plate and screws. Alignment after fixation is grossly anatomic on these limited fluoroscopic images. IMPRESSION: Intraoperative images during ORIF of left tibial plateau fracture. Electronically Signed   By: Logan Bores M.D.   On: 09/10/2017 12:17   Dg C-arm 1-60 Min  Result Date: 09/10/2017 CLINICAL DATA:  ORIF of left tibial plateau fracture. EXAM: DG C-ARM 61-120 MIN; LEFT TIBIA AND FIBULA - 2 VIEW COMPARISON:  Left knee radiographs and CT 09/01/2017 FLUOROSCOPY TIME:  C-arm fluoroscopic images were obtained intraoperatively and submitted for post operative interpretation. Please see the performing provider's procedural report for the fluoroscopy time utilized. FINDINGS: Nine intraoperative fluoroscopic images of the left knee are provided and demonstrate ORIF of the lateral tibial plateau fracture with placement of a lateral plate and screws. Alignment after fixation is grossly anatomic on these limited fluoroscopic images. IMPRESSION:  Intraoperative images during ORIF of left tibial plateau fracture. Electronically Signed   By: Logan Bores M.D.   On: 09/10/2017 12:17    Disposition: Discharge disposition: 01-Home or Self Care       Discharge Instructions    Discharge patient   Complete by:  As directed    Discharge disposition:  01-Home or Self Care   Discharge patient date:  09/12/2017      Follow-up [facility].   Specialty:  [facility] Why:  A representative from [facility] will contact you to arrange start date and time for your therapy. Contact information: [address] [place] [address] [phone]  [facility].   Why:  patient will have RW, WC, 3/1 delivered to room for DC Contact information: [place] [id number] [phone]            Signed: Prudencio Burly III PA-C 09/12/2017, 7:09 PM

## 2017-09-12 NOTE — Progress Notes (Signed)
Patient suffers from L tibial plateau fracture s/p ORIF and NWB for 6 weeks which impairs their ability to perform daily activities like ambulating in the home.  A walker alone will not resolve the issues with performing activities of daily living. A wheelchair will allow patient to safely perform daily activities.  The patient can self propel in the home or has a caregiver who can provide assistance.    Erline Levine, PTA Pager: [phone]

## 2017-09-12 NOTE — Progress Notes (Signed)
Md paged multiple times to obtain an order for [facility] for this patient. As per CM, we need an order from the MD , CM stated she already ordered the equipment while we wait for the order and that if I have problem to talk it with the charge nurse. Dr. Carola Frost called and relayed what I needed, MD stated he don't know how to put that order in and the only thing he can do is to give me a verbal order for it which I relayed to the charge nurse. CM called to follow up the equipment and CM stated that MD had to put the order in and that nurses cannot put the order in. Charge nurse made aware. I also follow up on bedside commode per OT recommendation and CM stated that MD also had to put it in. Verbal order was put under Dr. Carola Frost. CM made aware of the order.Still awaiting for the [facility] equipment.

## 2017-09-12 NOTE — Progress Notes (Signed)
Orthopedic progress note  Patient ID: Kimberly Potter MRN: [id number] DOB/AGE: Nov 16, 1985 32 y.o.  Subjective: Doing well postop day 2 Some difficult pain control overnight but no pain at rest.  Current regimen working. Eating, drinking, and voiding.   Starting to mobilize with therapies   Objective:   VITALS:   Vitals:   09/11/17 0500 09/11/17 1455 09/11/17 2226 09/12/17 0500  BP: 109/72 118/77 124/73 116/61  Pulse: 75 99 90 71  Resp:  18    Temp: 98.4 F (36.9 C) 98.5 F (36.9 C) 98.3 F (36.8 C) 98.7 F (37.1 C)  TempSrc: Oral Oral Oral Oral  SpO2: 94% 99% 98% 98%    Estimated body mass index is 38.16 kg/m as calculated from the following:   Height as of 05/29/16:  (1.727 m).   Weight as of 05/29/16: 113.9 kg (251 lb).   Intake/Output      05/17 0701 - 05/18 0700   P.O. 240   I.V. 800   IV Piggyback 0   Total Intake 1040   Urine 1000   Total Output 1000   Net +40       Urine Occurrence 2 x     LABS  Results for Kimberly, Potter (MRN [id number]) as of 09/11/2017 11:53  Ref. Range 09/10/2017 09:38  Vitamin D, 25-Hydroxy Latest Ref Range: 30.0 - 100.0 ng/mL 24.0 (L)    PHYSICAL EXAM:   Gen: Alert.  NAD.  Calm, conversant. Lungs: breathing unlabored Cardiac: Regular  Ext:       Left Lower Extremity   Hinged knee brace fitting well  Dressing c/d/i  Ext warm  Compartments are soft, no pain with passive stretch   DPN, SPN, TN sensation intact  EHL, FHL, AT, PT, peroneals, gastroc motor intact  Good active and passive ankle ROM  No DCT  Swelling controlled     Assessment/Plan: 2 Days Post-Op   Active Problems:   Closed fracture of left tibial plateau   Bipolar 1 disorder (HCC)   Anxiety   Nicotine dependence   Marijuana use   Vitamin D insufficiency   Anti-infectives (From admission, onward)   Start     Dose/Rate Route Frequency Ordered Stop   09/10/17 1630  ceFAZolin (ANCEF) IVPB 1 g/50 mL premix     1 g 100 mL/hr over 30 Minutes  Intravenous Every 6 hours 09/10/17 1537 09/11/17 0535   09/10/17 0600  ceFAZolin (ANCEF) IVPB 2g/100 mL premix     2 g 200 mL/hr over 30 Minutes Intravenous On call to O.R. 09/10/17 7829 09/10/17 0835    .  POD/HD#: 2   32 y/o female s/p ORIF L tibial plateau fracture   - L tibial plateau fracture s/p ORIF  NWB x 6 weeks  Unrestricted ROM L knee, hinged brace for comfort  PT and OT evals  Ice prn   Elevate leg for swelling control, elevate above heart when possible  Dressing change tomorrow     HEP for Left knee ROM- AROM, PROM. Prone exercises as well. No ROM restrictions. Quad sets, SLR, LAQ, SAQ, heel slides, stretching, prone flexion and extension    Ankle theraband program, heel cord stretching, toe towel curls, etc    No pillows under bend of knee when at rest, ok to place under heel to help work on extension. Can also use zero knee bone foam if available  - Pain management:   Dilaudid 2 mg 1-2 po q6h prn severe pain    Tylenol 1000 mg  po q6h scheduled   Robaxin 1000 mg po q6h scheduled   - ABL anemia/Hemodynamics  Vitals stable  - Medical issues   Bipolar    Home meds    - DVT/PE prophylaxis:  lovenox x 21 days post op    Will see if pt qualifies for match program    If unable to get lovenox will do ASA 325 mg po q12h x 4 weeks but this is not nearly as effective   - ID:   periop abx to be completed today  - Metabolic Bone Disease:  Vitamin d insufficiency    Vitamin d supplementation    Vitamin C to assist with collagen synthesis   - Activity:  NWB L leg   OOB as tolerated   - FEN/GI prophylaxis/Foley/Lines:  Reg diet  KVO IVF  - Impediments to fracture healing:  Drug use   - Dispo:  Continue with therapies   Home today   Albina Billet III, PA-C 09/12/2017 6:36 AM

## 2017-09-17 NOTE — Op Note (Addendum)
09/10/2017  2:18 PM  PATIENT:  Kimberly Potter  32 y.o. female  PRE-OPERATIVE DIAGNOSIS:  LEFT LATERAL TIBIAL PLATEAU FRACTURE   POST-OPERATIVE DIAGNOSIS:  LEFT LATERAL TIBIAL PLATEAU FRACTURE   PROCEDURES:   1. ORIF OF LEFT LATERAL TIBIAL PLATEAU FRACTURE  2. ANTERIOR COMPARTMENT FASCIOTOMY 3. STRESS FLUOROSCOPY OF LEFT KNEE  SURGEON:  Surgeon(s) and Role:    Myrene Galas, MD - Primary  PHYSICIAN ASSISTANT: KEITH PAUL,PA-C  ANESTHESIA:   general  EBL:  250 mL   BLOOD ADMINISTERED:none  DRAINS: none   LOCAL MEDICATIONS USED:  NONE  SPECIMEN:  No Specimen  DISPOSITION OF SPECIMEN:  N/A  COUNTS:  YES  TOURNIQUET:  NONE  DICTATION: .Note written in EPIC  PLAN OF CARE: Admit to inpatient   PATIENT DISPOSITION:  PACU - hemodynamically stable.   Delay start of Pharmacological VTE agent (>24hrs) due to surgical blood loss or risk of bleeding: no   BRIEF SUMMARY AND INDICATION FOR PROCEDURE:  Patient is a 32 y.o.-year- old with a tibial plateau fracture, treated provisionally with ice, elevation, and active motion of the foot and toes to facilitate resolution of soft tissue swelling.  We did discuss with the patient and her mother the risks and benefits of surgical treatment including the potential for arthritis, nerve injury, vessel injury, loss of motion, DVT, PE, heart attack, stroke, symptomatic hardware, need for further surgery, and multiple others.  The patient acknowledged these risks and wished to proceed.  BRIEF SUMMARY OF PROCEDURE:  After administration of preoperative antibiotics, the patient was taken to the operating room.  General anesthesia was induced and the lower extremity prepped and draped in usual sterile fashion using a chlorhexidine wash and betadine scrub and paint. A timeout was performed.  I then brought in the radiolucent triangle.  A curvilinear incision was made extending laterally over Gerdy's tubercle. Dissection was carried down where  the soft tissues were left intact to the lateral plateau and rim.  I did incise the retinaculum proximal to the tibial plateau, and then going along inside the retinaculum performed a submeniscal arthrotomy, releasing the coronary ligament along its insertion onto the tibia. Zero prolene suture was used to reflect this and inspect the meniscus and joint surface. The joint was irrigated thoroughly and this revealed an intact lateral meniscus tear. The joint surface was markedly depressed.  We then released some of the anterior extensors to enable the plate to fit along the proximal shaft. Using a freer to mobilize the depressed segments and k-wires as joy sticks, I maneuvered the joint into a reduced position while my assistant pulled traction, watching it through the arthrotomy.  I then placed the plate laterally and used the [employer] clamp to apply a compressive force across the joint line. This reduced the widened plateau back to the appropriate size.  At this point, we placed standard fixation in the proximal row of the plate followed by locked fixation.  This was followed by additional fixation within the shaft. My assistant was careful to control alignment throughout by using traction and bending forces. He also assited with retraction. All wounds were irrigated thoroughly. I then performed stress fluoro evaluation of the knee to assess for ligamentous instability and did not identify collateral ligament injury.  Prior to closure, I turned my attention to the distal edge of the wound here underneath the skin.  I used the long scissors to spread both superficial and deep to the anterior compartment.  The fascia was then  released for 8 to 10 cm to reduce the likelihood of the postoperative compartment syndrome.  Once more, wound was irrigated and then a standard layered closure performed, 0 Vicryl, 2-0 Vicryl, and 3-0 nylon for the skin.  Sterile gently compressive dressing was applied and in knee  immobilizer.  The patient was taken to the PACU in stable condition.  PROGNOSIS: The patient will be transitioned into a hinged knee brace with unrestricted range of motion and this will begin immediately.  She will be nonweightbearing on the operative extremity, be on pharmacologic DVT prophylaxis, and mobilized with PT and OT. After discharge, we will plan to see her back in about 2 weeks for removal of sutures and we will continue to follow throughout the hospital stay.     Kimberly Potter. Carola Frost, M.D.

## 2017-10-28 ENCOUNTER — Other Ambulatory Visit: Payer: Self-pay

## 2017-10-28 ENCOUNTER — Ambulatory Visit: Payer: Self-pay | Attending: Orthopedic Surgery | Admitting: Physical Therapy

## 2017-10-28 ENCOUNTER — Telehealth: Payer: Self-pay | Admitting: General Practice

## 2017-10-28 DIAGNOSIS — M25662 Stiffness of left knee, not elsewhere classified: Secondary | ICD-10-CM | POA: Insufficient documentation

## 2017-10-28 NOTE — Telephone Encounter (Signed)
Faxed notes to Dr Diannia Rudereborah Ross in [place]

## 2017-10-28 NOTE — Therapy (Signed)
[facility] [address] [place], [place], [address] Phone: [phone]   Fax:  [phone]  Physical Therapy Evaluation  Patient Details  Name: Kimberly Potter Howk MRN: [id number] Date of Birth: 06/21/1985 Referring Provider: Myrene GalasMichael Handy MD   Encounter Date: 10/28/2017  PT End of Session - 10/28/17 1211    Visit Number  1    Number of Visits  16    Date for PT Re-Evaluation  12/23/17    PT Start Time  0900    PT Stop Time  0949    PT Time Calculation (min)  49 min       Past Medical History:  Diagnosis Date  . Anxiety   . Bipolar 1 disorder (HCC)   . Depression   . Marijuana use 09/11/2017  . Nicotine dependence 09/11/2017    Past Surgical History:  Procedure Laterality Date  . ORIF TIBIA PLATEAU Left 09/10/2017   Procedure: OPEN REDUCTION INTERNAL FIXATION (ORIF) TIBIAL PLATEAU;  Surgeon: Myrene GalasHandy, Michael, MD;  Location: [facility] OR;  Service: Orthopedics;  Laterality: Left;  . TYMPANOSTOMY      There were no vitals filed for this visit.   Subjective Assessment - 10/28/17 1240    Subjective  The patient sustained a left tibial plateau fracture on 09/01/17 and had an ORIF on 09/10/17.  She is reporting no pain at rest today.  She presented to the clinic today witha FWW and NWBing over her left LE.  The patient reports she is compliant to her HEP with the focus on left knee flexion.    Patient Stated Goals  Walk normal again.    Currently in Pain?  No/denies         [facility] - 10/28/17 0001      Assessment   Medical Diagnosis  Left tibia fracture.    Referring Provider  Myrene GalasMichael Handy MD    Onset Date/Surgical Date  -- 09/01/17(DOI).      Precautions   Precautions  -- OK per MD for TDWBing over left LE.      Restrictions   Weight Bearing Restrictions  -- TDWBing left LE.      Balance Screen   Has the patient fallen in the past 6 months  Yes    Has the patient had a decrease in activity level because of a fear of  falling?   Yes    Is the patient reluctant to leave their home because of a fear of falling?   Yes      Home Environment   Living Environment  Private residence      Prior Function   Level of Independence  Independent      Observation/Other Assessments   Observations  Incisional site appears to be healing well.      Observation/Other Assessments-Edema    Edema  Circumferential      Circumferential Edema   Circumferential - Left   LT 3 cms > RT.      ROM / Strength   AROM / PROM / Strength  AROM;Strength      AROM   Overall AROM Comments  Full left knee extension with flexion to 78 degrees.      Strength   Overall Strength Comments  Antigravity left hip motion.      Palpation   Palpation comment  Patient c/o mild tenderness and "nerve" discomfort around her left proximal tibia incisional site.      Ambulation/Gait   Gait Comments  Patient on FWW  and NWBing over her left LE.                Objective measurements completed on examination: See above findings.      [facility] Adult PT Treatment/Exercise - 10/28/17 0001      Modalities   Modalities  Electrical Stimulation;Moist Heat      Moist Heat Therapy   Number Minutes Moist Heat  15 Minutes    Moist Heat Location  -- Left knee.      Programme researcher, broadcasting/film/video Location  Left proximal tibial region.    Electrical Stimulation Action  IFC    Electrical Stimulation Parameters  80-150 Hz x 15 minutes.             PT Education - 10/28/17 1244    Education Details  HEP.    Person(s) Educated  Patient    Methods  Explanation    Comprehension  Verbalized understanding          PT Long Term Goals - 10/28/17 1253      PT LONG TERM GOAL #1   Title  Indepedent with a HEP.    Time  8    Period  Weeks    Status  New      PT LONG TERM GOAL #2   Title  Active left knee flexion to 125 degrees+ so the patient can perform functional tasks and do so with pain not > 2-3/10.    Time  8     Period  Weeks    Status  New      PT LONG TERM GOAL #3   Title  Increase left hip and knee strength to a solid 4+/5 to provide good stability for accomplishment of functional activities.    Time  8    Period  Weeks    Status  New      PT LONG TERM GOAL #4   Title  Perform a reciprocating stair gait with one railing with pain not > 2-3/10.    Time  8    Period  Weeks    Status  New      PT LONG TERM GOAL #5   Title  Walk in clinic 500 feet without assistive device and no deviation.    Time  8    Period  Weeks    Status  New             Plan - 10/28/17 1249    Clinical Impression Statement  The patient presents to OPP[facility] s/p ORIF on left tibial plateau fracture.  She presented to the clinic today NWBing over her left LE with a FWW.  Her flexion is limited to 78 degrees and she has some edema and palpable tenderness around her incisional site.  Patient will benefit from skilled physical therapy intervention to address pain and deficits.    Clinical Presentation  Stable    Clinical Presentation due to:  Good surgical outcome.    Clinical Decision Making  Low    Rehab Potential  Excellent    PT Frequency  2x / week    PT Treatment/Interventions  ADLs/Self Care Home Management;Cryotherapy;Academic librarian;Therapeutic activities;Therapeutic exercise;Neuromuscular re-education;Patient/family education;Passive range of motion;Manual techniques    PT Next Visit Plan  Per MD order:  "Begin TDWB for 2 weeks the progressive WBAT.  Aggressive ROM, exercise bike, home program."  e'stim/HMP.    Consulted and Agree with Plan of Care  Patient  Patient will benefit from skilled therapeutic intervention in order to improve the following deficits and impairments:  Abnormal gait, Decreased activity tolerance, Decreased range of motion, Increased edema, Pain  Visit Diagnosis: Stiffness of left knee, not elsewhere classified - Plan: PT plan of care  cert/re-cert     Problem List Patient Active Problem List   Diagnosis Date Noted  . Nicotine dependence 09/11/2017  . Marijuana use 09/11/2017  . Vitamin D insufficiency 09/11/2017  . Bipolar 1 disorder (HCC)   . Anxiety   . Closed fracture of left tibial plateau 09/10/2017  . Anxiety and depression 10/10/2015    Jessy Cybulski, [place] MPT 10/28/2017, 12:57 PM  [facility] [address] [place], [place], [address] Phone: [phone]   Fax:  [phone]  Name: Seleni Meller MRN: [id number] Date of Birth: 22-Feb-1986

## 2017-11-02 ENCOUNTER — Ambulatory Visit: Payer: Self-pay | Admitting: Physical Therapy

## 2017-11-02 DIAGNOSIS — M25662 Stiffness of left knee, not elsewhere classified: Secondary | ICD-10-CM

## 2017-11-02 NOTE — Therapy (Signed)
[facility] [address] [place], [place], [address] Phone: [phone]   Fax:  [phone]  Physical Therapy Treatment  Patient Details  Name: Kimberly Potter MRN: [id number] Date of Birth: March 18, 1986 Referring Provider: Myrene Galas MD   Encounter Date: 11/02/2017  PT End of Session - 11/02/17 0920    Visit Number  2    Number of Visits  16    Date for PT Re-Evaluation  12/23/17    PT Start Time  0905    PT Stop Time  0948    PT Time Calculation (min)  43 min    Equipment Utilized During Treatment  Other (comment) FWW    Activity Tolerance  Patient tolerated treatment well    Behavior During Therapy  [facility] for tasks assessed/performed       Past Medical History:  Diagnosis Date  . Anxiety   . Bipolar 1 disorder (HCC)   . Depression   . Marijuana use 09/11/2017  . Nicotine dependence 09/11/2017    Past Surgical History:  Procedure Laterality Date  . ORIF TIBIA PLATEAU Left 09/10/2017   Procedure: OPEN REDUCTION INTERNAL FIXATION (ORIF) TIBIAL PLATEAU;  Surgeon: Myrene Galas, MD;  Location: [facility] OR;  Service: Orthopedics;  Laterality: Left;  . TYMPANOSTOMY      There were no vitals filed for this visit.  Subjective Assessment - 11/02/17 0943    Subjective  Denies any pain upon arrival only reporting that she feels like she can get somewhere by hopping with LLE NWB    Patient Stated Goals  Walk normal again.    Currently in Pain?  No/denies         [facility] PT Assessment - 11/02/17 0001      Assessment   Medical Diagnosis  Left tibia fracture.    Onset Date/Surgical Date  09/01/17 09/10/2017 DOS    Next MD Visit  TBD                   [facility] Adult PT Treatment/Exercise - 11/02/17 0001      Ambulation/Gait   Ambulation/Gait  Yes    Ambulation/Gait Assistance  5: Supervision    Ambulation/Gait Assistance Details  Supervision secondary to education of new gait pattern NWB to TDWB    Ambulation Distance (Feet)   85 Feet    Assistive device  Rolling walker    Gait Pattern  Step-to pattern;Decreased stance time - left;Decreased stride length;Decreased step length - left;Decreased weight shift to left;Antalgic;Narrow base of support    Ambulation Surface  Level;Indoor      Exercises   Exercises  Knee/Hip      Knee/Hip Exercises: Supine   Short Arc Quad Sets  AROM;Left;2 sets;10 reps;Limitations    Short Arc Quad Sets Limitations  5/10 sec hold    Heel Slides  AROM;Left;3 sets;10 reps;Limitations    Heel Slides Limitations  at wall and on plinth table each    Straight Leg Raises  AROM;Left;15 reps      Modalities   Modalities  Electrical Stimulation;Moist Heat      Moist Heat Therapy   Number Minutes Moist Heat  15 Minutes    Moist Heat Location  Knee      Electrical Stimulation   Electrical Stimulation Location  Left proximal tibial region.    Electrical Stimulation Action  IFC    Electrical Stimulation Parameters  80-150 hz x15 min    Electrical Stimulation Goals  Edema  PT Education - 11/02/17 [id number]    Education Details  HEP- SAQ, heel slides, prone knee flexion    Person(s) Educated  Patient    Methods  Explanation;Handout    Comprehension  Verbalized understanding          PT Long Term Goals - 10/28/17 1253      PT LONG TERM GOAL #1   Title  Indepedent with a HEP.    Time  8    Period  Weeks    Status  New      PT LONG TERM GOAL #2   Title  Active left knee flexion to 125 degrees+ so the patient can perform functional tasks and do so with pain not > 2-3/10.    Time  8    Period  Weeks    Status  New      PT LONG TERM GOAL #3   Title  Increase left hip and knee strength to a solid 4+/5 to provide good stability for accomplishment of functional activities.    Time  8    Period  Weeks    Status  New      PT LONG TERM GOAL #4   Title  Perform a reciprocating stair gait with one railing with pain not > 2-3/10.    Time  8    Period  Weeks    Status   New      PT LONG TERM GOAL #5   Title  Walk in clinic 500 feet without assistive device and no deviation.    Time  8    Period  Weeks    Status  New            Plan - 11/02/17 0944    Clinical Impression Statement  Patient tolerated today's treatment well with no reports of pain upon arrival or throughout treatment. Patient presented in clinic with FWW and NWB although patient educated that TDWB was expected per MD orders. Patient guided through ROM and light strengthening without complaints of pain. Patient provided HEP for ROM and light strengthening with education regarding parameters and technique. Patient also educated regarding TDWB and to progress herself to that. Normal modalities response noted following removal with only minimal redness from moist heat pack along medial L knee.    Rehab Potential  Excellent    PT Frequency  2x / week    PT Treatment/Interventions  ADLs/Self Care Home Management;Cryotherapy;Academic librarianlectrical Stimulation;Moist Heat;Gait training;Therapeutic activities;Therapeutic exercise;Neuromuscular re-education;Patient/family education;Passive range of motion;Manual techniques    PT Next Visit Plan  Per MD order:  "Begin TDWB for 2 weeks the progressive WBAT.  Aggressive ROM, exercise bike, home program."  e'stim/HMP.    PT Home Exercise Plan  HEP- heel slide, prone knee flexion, SAQ    Consulted and Agree with Plan of Care  Patient       Patient will benefit from skilled therapeutic intervention in order to improve the following deficits and impairments:  Abnormal gait, Decreased activity tolerance, Decreased range of motion, Increased edema, Pain  Visit Diagnosis: Stiffness of left knee, not elsewhere classified     Problem List Patient Active Problem List   Diagnosis Date Noted  . Nicotine dependence 09/11/2017  . Marijuana use 09/11/2017  . Vitamin D insufficiency 09/11/2017  . Bipolar 1 disorder (HCC)   . Anxiety   . Closed fracture of left tibial  plateau 09/10/2017  . Anxiety and depression 10/10/2015    Marvell FullerKelsey P Kennon, PTA 11/02/2017, 10:59 AM  [facility] [address] [place], [place], [address] Phone: [phone]   Fax:  [phone]  Name: Kimberly Potter MRN: [id number] Date of Birth: 10-03-1985

## 2017-11-05 ENCOUNTER — Encounter: Payer: Self-pay | Admitting: Physical Therapy

## 2017-11-05 ENCOUNTER — Ambulatory Visit: Payer: Self-pay | Admitting: Physical Therapy

## 2017-11-05 DIAGNOSIS — M25662 Stiffness of left knee, not elsewhere classified: Secondary | ICD-10-CM

## 2017-11-05 NOTE — Therapy (Signed)
[facility] [address] [place], [place], [address] Phone: [phone]   Fax:  [phone]  Physical Therapy Treatment  Patient Details  Name: Kimberly Potter MRN: [id number] Date of Birth: 28-Oct-1985 Referring Provider: Myrene Galas MD   Encounter Date: 11/05/2017  PT End of Session - 11/05/17 0922    Visit Number  3    Number of Visits  16    Date for PT Re-Evaluation  12/23/17    PT Start Time  0909    PT Stop Time  0958    PT Time Calculation (min)  49 min    Equipment Utilized During Treatment  Other (comment) FWW    Activity Tolerance  Patient tolerated treatment well    Behavior During Therapy  [facility] for tasks assessed/performed       Past Medical History:  Diagnosis Date  . Anxiety   . Bipolar 1 disorder (HCC)   . Depression   . Marijuana use 09/11/2017  . Nicotine dependence 09/11/2017    Past Surgical History:  Procedure Laterality Date  . ORIF TIBIA PLATEAU Left 09/10/2017   Procedure: OPEN REDUCTION INTERNAL FIXATION (ORIF) TIBIAL PLATEAU;  Surgeon: Myrene Galas, MD;  Location: [facility] OR;  Service: Orthopedics;  Laterality: Left;  . TYMPANOSTOMY      There were no vitals filed for this visit.  Subjective Assessment - 11/05/17 0915    Subjective  Denies any pain upon arrival. Asked if she could begin driving.    Patient Stated Goals  Walk normal again.    Currently in Pain?  No/denies         [facility] PT Assessment - 11/05/17 0001      Assessment   Medical Diagnosis  Left tibia fracture.    Onset Date/Surgical Date  09/01/17    Next MD Visit  TBD                   [facility] Adult PT Treatment/Exercise - 11/05/17 0001      Knee/Hip Exercises: Supine   Quad Sets  AROM;Left;2 sets;10 reps    Short Arc [employer]  AROM;Left;3 sets;10 reps    Heel Slides  AROM;Left;3 sets;10 reps;Limitations    Hip Adduction Isometric  Strengthening;Both;3 sets;10 reps    Straight Leg Raises  AROM;Left;3 sets;10 reps     Other Supine Knee/Hip Exercises  L hip abduction AROM x30 reps      Modalities   Modalities  Electrical Stimulation;Moist Heat      Moist Heat Therapy   Number Minutes Moist Heat  15 Minutes    Moist Heat Location  Knee      Electrical Stimulation   Electrical Stimulation Location  Left proximal tibial region.    Electrical Stimulation Action  IFC    Electrical Stimulation Parameters  80-150 hz x15 min    Electrical Stimulation Goals  Edema                  PT Long Term Goals - 10/28/17 1253      PT LONG TERM GOAL #1   Title  Indepedent with a HEP.    Time  8    Period  Weeks    Status  New      PT LONG TERM GOAL #2   Title  Active left knee flexion to 125 degrees+ so the patient can perform functional tasks and do so with pain not > 2-3/10.    Time  8    Period  Weeks    Status  New      PT LONG TERM GOAL #3   Title  Increase left hip and knee strength to a solid 4+/5 to provide good stability for accomplishment of functional activities.    Time  8    Period  Weeks    Status  New      PT LONG TERM GOAL #4   Title  Perform a reciprocating stair gait with one railing with pain not > 2-3/10.    Time  8    Period  Weeks    Status  New      PT LONG TERM GOAL #5   Title  Walk in clinic 500 feet without assistive device and no deviation.    Time  8    Period  Weeks    Status  New            Plan - 11/05/17 1020    Clinical Impression Statement  Patient tolerated today's treatment well as she arrived with no pain. Patient asked regarding if she could begin driving which PTA deferred question to MD approval. Patient denies taking pain medications at this time. Patient able to complete exercises without any reports of pain. Patient demonstrated minimal L extensor lag with SLR. Patient observed utilizing TDWB gait pattern with FWW today in clinic. Normal modalities response noted following removal of the modalities.    Rehab Potential  Excellent    PT  Frequency  2x / week    PT Treatment/Interventions  ADLs/Self Care Home Management;Cryotherapy;Academic librarianlectrical Stimulation;Moist Heat;Gait training;Therapeutic activities;Therapeutic exercise;Neuromuscular re-education;Patient/family education;Passive range of motion;Manual techniques    PT Next Visit Plan  Per MD order:  "Begin TDWB for 2 weeks the progressive WBAT.  Aggressive ROM, exercise bike, home program."  e'stim/HMP.    PT Home Exercise Plan  HEP- heel slide, prone knee flexion, SAQ    Consulted and Agree with Plan of Care  Patient       Patient will benefit from skilled therapeutic intervention in order to improve the following deficits and impairments:  Abnormal gait, Decreased activity tolerance, Decreased range of motion, Increased edema, Pain  Visit Diagnosis: Stiffness of left knee, not elsewhere classified     Problem List Patient Active Problem List   Diagnosis Date Noted  . Nicotine dependence 09/11/2017  . Marijuana use 09/11/2017  . Vitamin D insufficiency 09/11/2017  . Bipolar 1 disorder (HCC)   . Anxiety   . Closed fracture of left tibial plateau 09/10/2017  . Anxiety and depression 10/10/2015    Marvell FullerKelsey P Jazzmyne Rasnick, PTA 11/05/2017, 10:26 AM  [facility] [address] [place], [place], [address] Phone: [phone]   Fax:  [phone]  Name: Kimberly GageWhitney Potter MRN: [id number] Date of Birth: 07/14/1985

## 2017-11-09 ENCOUNTER — Ambulatory Visit: Payer: Self-pay | Admitting: Physical Therapy

## 2017-11-09 DIAGNOSIS — M25662 Stiffness of left knee, not elsewhere classified: Secondary | ICD-10-CM

## 2017-11-09 NOTE — Therapy (Signed)
[facility] [address] [place], [place], [address] Phone: [phone]   Fax:  [phone]  Physical Therapy Treatment  Patient Details  Name: Kimberly Potter MRN: [id number] Date of Birth: 1985/08/28 Referring Provider: Myrene Galas MD   Encounter Date: 11/09/2017  PT End of Session - 11/09/17 0937    Visit Number  4    Number of Visits  16    Date for PT Re-Evaluation  12/23/17    PT Start Time  0912    PT Stop Time  0959    PT Time Calculation (min)  47 min    Activity Tolerance  Patient tolerated treatment well    Behavior During Therapy  [facility] for tasks assessed/performed       Past Medical History:  Diagnosis Date  . Anxiety   . Bipolar 1 disorder (HCC)   . Depression   . Marijuana use 09/11/2017  . Nicotine dependence 09/11/2017    Past Surgical History:  Procedure Laterality Date  . ORIF TIBIA PLATEAU Left 09/10/2017   Procedure: OPEN REDUCTION INTERNAL FIXATION (ORIF) TIBIAL PLATEAU;  Surgeon: Myrene Galas, MD;  Location: [facility] OR;  Service: Orthopedics;  Laterality: Left;  . TYMPANOSTOMY      There were no vitals filed for this visit.  Subjective Assessment - 11/09/17 0944    Subjective  Patent 12 min late due to dogs getting out. She denies pain with TDWB.    Patient Stated Goals  Walk normal again.    Currently in Pain?  No/denies                       [facility] Adult PT Treatment/Exercise - 11/09/17 0001      Ambulation/Gait   Ambulation/Gait  Yes    Ambulation/Gait Assistance  6: Modified independent (Device/Increase time)    Ambulation/Gait Assistance Details  RW WBAT    Ambulation Distance (Feet)  40 Feet    Assistive device  Rolling walker    Gait Pattern  Step-to pattern    Ambulation Surface  Level;Indoor    Pre-Gait Activities  weight shifting in semi tandem stance; wt shifting onto LLE      Knee/Hip Exercises: Supine   Short Arc Quad Sets  AROM;Left;3 sets;10 reps    Short Arc Quad  Sets Limitations  5 sec hold    Straight Leg Raises  Strengthening;Left;10 reps;1 set      Manual Therapy   Manual Therapy  Passive ROM    Passive ROM  into flexion in supine and sitting also reviewed flexion stretches             PT Education - 11/09/17 1043    Education Details  HEP - weight shifting side to side and semi tandem; increasing hold time with flexion stretches    Person(s) Educated  Patient    Methods  Explanation;Demonstration;Verbal cues    Comprehension  Verbalized understanding;Returned demonstration          PT Long Term Goals - 10/28/17 1253      PT LONG TERM GOAL #1   Title  Indepedent with a HEP.    Time  8    Period  Weeks    Status  New      PT LONG TERM GOAL #2   Title  Active left knee flexion to 125 degrees+ so the patient can perform functional tasks and do so with pain not > 2-3/10.    Time  8  Period  Weeks    Status  New      PT LONG TERM GOAL #3   Title  Increase left hip and knee strength to a solid 4+/5 to provide good stability for accomplishment of functional activities.    Time  8    Period  Weeks    Status  New      PT LONG TERM GOAL #4   Title  Perform a reciprocating stair gait with one railing with pain not > 2-3/10.    Time  8    Period  Weeks    Status  New      PT LONG TERM GOAL #5   Title  Walk in clinic 500 feet without assistive device and no deviation.    Time  8    Period  Weeks    Status  New            Plan - 11/09/17 1044    Clinical Impression Statement  pt did well with gait training today and was able to walk with step to gait pattern. She still exhibits slight quad lag with SLR. Treatment abbreviated due to coming late.    PT Treatment/Interventions  ADLs/Self Care Home Management;Cryotherapy;Academic librarianlectrical Stimulation;Moist Heat;Gait training;Therapeutic activities;Therapeutic exercise;Neuromuscular re-education;Patient/family education;Passive range of motion;Manual techniques    PT Next Visit  Plan  Per MD order:  WBAT.  Aggressive ROM, exercise bike, home program."  e'stim/HMP.    PT Home Exercise Plan  HEP- heel slide, prone knee flexion, SAQ, weight shifting    Consulted and Agree with Plan of Care  Patient       Patient will benefit from skilled therapeutic intervention in order to improve the following deficits and impairments:  Abnormal gait, Decreased activity tolerance, Decreased range of motion, Increased edema, Pain  Visit Diagnosis: Stiffness of left knee, not elsewhere classified     Problem List Patient Active Problem List   Diagnosis Date Noted  . Nicotine dependence 09/11/2017  . Marijuana use 09/11/2017  . Vitamin D insufficiency 09/11/2017  . Bipolar 1 disorder (HCC)   . Anxiety   . Closed fracture of left tibial plateau 09/10/2017  . Anxiety and depression 10/10/2015    Leif Loflin PT 11/09/2017, 10:49 AM  [facility] [address] [place], [place], [address] Phone: [phone]   Fax:  [phone]  Name: Odelia GageWhitney Hersch MRN: [id number] Date of Birth: 04/07/1986

## 2017-11-12 ENCOUNTER — Encounter: Payer: Self-pay | Admitting: Physical Therapy

## 2017-11-12 ENCOUNTER — Ambulatory Visit: Payer: Self-pay | Admitting: Physical Therapy

## 2017-11-12 DIAGNOSIS — M25662 Stiffness of left knee, not elsewhere classified: Secondary | ICD-10-CM

## 2017-11-12 NOTE — Therapy (Signed)
[facility] [address] [place], [place], [address] Phone: [phone]   Fax:  [phone]  Physical Therapy Treatment  Patient Details  Name: Kimberly Potter MRN: [id number] Date of Birth: 04/23/1986 Referring Provider: Myrene GalasMichael Handy MD   Encounter Date: 11/12/2017  PT End of Session - 11/12/17 0919    Visit Number  5    Number of Visits  16    Date for PT Re-Evaluation  12/23/17    PT Start Time  0915    PT Stop Time  0946 2 units due to late arrival    PT Time Calculation (min)  31 min    Equipment Utilized During Treatment  Other (comment) FWW    Activity Tolerance  Patient tolerated treatment well    Behavior During Therapy  [facility] for tasks assessed/performed       Past Medical History:  Diagnosis Date  . Anxiety   . Bipolar 1 disorder (HCC)   . Depression   . Marijuana use 09/11/2017  . Nicotine dependence 09/11/2017    Past Surgical History:  Procedure Laterality Date  . ORIF TIBIA PLATEAU Left 09/10/2017   Procedure: OPEN REDUCTION INTERNAL FIXATION (ORIF) TIBIAL PLATEAU;  Surgeon: Myrene GalasHandy, Michael, MD;  Location: [facility];  Service: Orthopedics;  Laterality: Left;  . TYMPANOSTOMY      There were no vitals filed for this visit.  Subjective Assessment - 11/12/17 0918    Subjective  Reports that she has a restirator under her desk and uses it while working.     Patient Stated Goals  Walk normal again.    Currently in Pain?  No/denies         [facility] - 11/12/17 0001      Assessment   Medical Diagnosis  Left tibia fracture.    Onset Date/Surgical Date  09/01/17    Next MD Visit  TBD                   [facility] Adult PT Treatment/Exercise - 11/12/17 0001      Knee/Hip Exercises: Aerobic   Nustep  L1, seat 12 x15 min      Knee/Hip Exercises: Supine   Short Arc Quad Sets  Strengthening;Left;2 sets;10 reps 10 sec hold    Heel Slides  AROM;Left;2 sets;10 reps    Straight Leg Raises   Strengthening;Left;3 sets;10 reps Minimal extensor lag                  PT Long Term Goals - 11/12/17 0945      PT LONG TERM GOAL #1   Title  Indepedent with a HEP.    Time  8    Period  Weeks    Status  On-going      PT LONG TERM GOAL #2   Title  Active left knee flexion to 125 degrees+ so the patient can perform functional tasks and do so with pain not > 2-3/10.    Time  8    Period  Weeks    Status  On-going      PT LONG TERM GOAL #3   Title  Increase left hip and knee strength to a solid 4+/5 to provide good stability for accomplishment of functional activities.    Time  8    Period  Weeks    Status  On-going      PT LONG TERM GOAL #4   Title  Perform a reciprocating stair gait with one railing with pain  not > 2-3/10.    Time  8    Period  Weeks    Status  On-going      PT LONG TERM GOAL #5   Title  Walk in clinic 500 feet without assistive device and no deviation.    Time  8    Period  Weeks    Status  On-going            Plan - 11/12/17 0947    Clinical Impression Statement  Patient tolerated today's treatment well with no pain upon arrival. Patient observed attempting more WBAT gait pattern with FWW. Patient reported noticing her knee leading with SLR and patient educated regarding extensor lag but also with attempting more QS prior to lifting LE. Nustep initiated today with low resistance and UE assist with WBAT. No complaints throughout treatment and no pain.    Rehab Potential  Excellent    PT Frequency  2x / week    PT Treatment/Interventions  ADLs/Self Care Home Management;Cryotherapy;Academic librarian;Therapeutic activities;Therapeutic exercise;Neuromuscular re-education;Patient/family education;Passive range of motion;Manual techniques    PT Next Visit Plan  Per MD order:  WBAT.  Aggressive ROM, exercise bike, home program."  e'stim/HMP.    PT Home Exercise Plan  HEP- heel slide, prone knee flexion, SAQ, weight  shifting    Consulted and Agree with Plan of Care  Patient       Patient will benefit from skilled therapeutic intervention in order to improve the following deficits and impairments:  Abnormal gait, Decreased activity tolerance, Decreased range of motion, Increased edema, Pain  Visit Diagnosis: Stiffness of left knee, not elsewhere classified     Problem List Patient Active Problem List   Diagnosis Date Noted  . Nicotine dependence 09/11/2017  . Marijuana use 09/11/2017  . Vitamin D insufficiency 09/11/2017  . Bipolar 1 disorder (HCC)   . Anxiety   . Closed fracture of left tibial plateau 09/10/2017  . Anxiety and depression 10/10/2015    Marvell Fuller, PTA 11/12/2017, 9:50 AM  [facility] [address] [place], [place], [address] Phone: [phone]   Fax:  [phone]  Name: Kimberly Potter MRN: [id number] Date of Birth: Apr 02, 1986

## 2017-11-16 ENCOUNTER — Encounter: Payer: Self-pay | Admitting: Physical Therapy

## 2017-11-17 ENCOUNTER — Encounter: Payer: Self-pay | Admitting: *Deleted

## 2017-11-19 ENCOUNTER — Ambulatory Visit: Payer: Self-pay | Admitting: Physical Therapy

## 2017-11-19 ENCOUNTER — Encounter: Payer: Self-pay | Admitting: Physical Therapy

## 2017-11-19 DIAGNOSIS — M25662 Stiffness of left knee, not elsewhere classified: Secondary | ICD-10-CM

## 2017-11-19 NOTE — Therapy (Addendum)
[facility] [place], [place], [address] Phone: [phone]   Fax:  [phone]  Physical Therapy Treatment PHYSICAL THERAPY DISCHARGE SUMMARY  Visits from Start of Care: 6  Current functional level related to goals / functional outcomes: See below   Remaining deficits: See goals   Education / Equipment: HEP Plan: Patient agrees to discharge.  Patient goals were not met. Patient is being discharged due to not returning since the last visit.  ?????   Gabriela Eves, PT, DPT 01/25/19   Patient Details  Name: Kimberly Potter MRN: [id number] Date of Birth: August 09, 1985 Referring Provider: Altamese Dickson MD   Encounter Date: 11/19/2017  PT End of Session - 11/19/17 1002    Visit Number  6    Number of Visits  16    Date for PT Re-Evaluation  12/23/17    PT Start Time  0952    PT Stop Time  1050    PT Time Calculation (min)  58 min    Equipment Utilized During Treatment  Other (comment) rolling walker    Activity Tolerance  Patient tolerated treatment well    Behavior During Therapy  [facility] for tasks assessed/performed       Past Medical History:  Diagnosis Date  . Anxiety   . Bipolar 1 disorder ([place])   . Depression   . Marijuana use 09/11/2017  . Nicotine dependence 09/11/2017    Past Surgical History:  Procedure Laterality Date  . ORIF TIBIA PLATEAU Left 09/10/2017   Procedure: OPEN REDUCTION INTERNAL FIXATION (ORIF) TIBIAL PLATEAU;  Surgeon: Altamese Greeley, MD;  [facility];  Service: Orthopedics;  Laterality: Left;  . TYMPANOSTOMY      There were no vitals filed for this visit.  Subjective Assessment - 11/19/17 0958    Subjective  Patient reports doctor would like to start weightbear as tolerated and progress to a cane.    Patient Stated Goals  Walk normal again.    Currently in Pain?  No/denies         [facility] PT Assessment - 11/19/17 0001      Assessment   Medical Diagnosis  Left tibia fracture.     Onset Date/Surgical Date  09/01/17    Next MD Visit  Dec 19, 2017      Ambulation/Gait   Ambulation Distance (Feet)  110 Feet    Gait Comments  cuing for increased knee extension during stance, as well as follow through of R LE when in L stance                   [facility] Adult PT Treatment/Exercise - 11/19/17 0001      Ambulation/Gait   Ambulation/Gait  Yes    Ambulation/Gait Assistance  6: Modified independent (Device/Increase time)    Assistive device  Rolling walker    Gait Pattern  Step-to pattern;Step-through pattern;Decreased hip/knee flexion - left;Decreased stance time - left;Decreased step length - right;Decreased weight shift to left;Left circumduction;Left flexed knee in stance    Ambulation Surface  Level;Indoor      Knee/Hip Exercises: Aerobic   Nustep  L1 x15 mins seat 12 to 10 to improve flexion      Knee/Hip Exercises: Supine   Short Arc Quad Sets  Strengthening;Left;2 sets;10 reps;Limitations    Short Arc Quad Sets Limitations  10 second hold    Straight Leg Raises  Strengthening;Left;10 reps;2 sets    Straight Leg Raise with External Rotation  -- noted with minimal extensor lag  Moist Heat Therapy   Number Minutes Moist Heat  10 Minutes    Moist Heat Location  Knee      Electrical Stimulation   Electrical Stimulation Location  L knee    Electrical Stimulation Action  IFC    Electrical Stimulation Parameters  80-150 hz x10 min    Electrical Stimulation Goals  Other (comment) patient request                  PT Long Term Goals - 11/12/17 0945      PT LONG TERM GOAL #1   Title  Indepedent with a HEP.    Time  8    Period  Weeks    Status  On-going      PT LONG TERM GOAL #2   Title  Active left knee flexion to 125 degrees+ so the patient can perform functional tasks and do so with pain not > 2-3/10.    Time  8    Period  Weeks    Status  On-going      PT LONG TERM GOAL #3   Title  Increase left hip and knee strength to a  solid 4+/5 to provide good stability for accomplishment of functional activities.    Time  8    Period  Weeks    Status  On-going      PT LONG TERM GOAL #4   Title  Perform a reciprocating stair gait with one railing with pain not > 2-3/10.    Time  8    Period  Weeks    Status  On-going      PT LONG TERM GOAL #5   Title  Walk in clinic 500 feet without assistive device and no deviation.    Time  8    Period  Weeks    Status  On-going            Plan - 11/19/17 1246    Clinical Impression Statement  Patient was able to tolerate treatment with no reports of increase pain. Patient required verbal cuing and demonstration to normalize gait pattern. Patient noted with step to gait pattern as well as L circumduction to advance L LE. Patient provided with cuing to which patient made improvements in gait. Patient still weightbears through UE and educated patient on the importance of quad control for amulation without walker. Patient educated to add quad strengthening exercises to home program. Patient reported undertanding. Patient requested E-stim and moist heat. Normal response to modalities upon removal.    Clinical Presentation  Stable    Clinical Decision Making  Low    Rehab Potential  Excellent    PT Frequency  2x / week    PT Treatment/Interventions  ADLs/Self Care Home Management;Cryotherapy;Software engineer;Therapeutic activities;Therapeutic exercise;Neuromuscular re-education;Patient/family education;Passive range of motion;Manual techniques    PT Next Visit Plan  Per MD order:  WBAT.  Aggressive ROM, exercise bike, home program."  e'stim/HMP.    Consulted and Agree with Plan of Care  Patient       Patient will benefit from skilled therapeutic intervention in order to improve the following deficits and impairments:  Abnormal gait, Decreased activity tolerance, Decreased range of motion, Increased edema, Pain  Visit Diagnosis: Stiffness of left  knee, not elsewhere classified     Problem List Patient Active Problem List   Diagnosis Date Noted  . Nicotine dependence 09/11/2017  . Marijuana use 09/11/2017  . Vitamin D insufficiency 09/11/2017  . Bipolar 1  disorder ([place])   . Anxiety   . Closed fracture of left tibial plateau 09/10/2017  . Anxiety and depression 10/10/2015   Gabriela Eves, PT, DPT 11/19/2017, 1:02 PM  [facility] [place], [place], [address] Phone: [phone]   Fax:  [phone]  Name: Kimberly Potter MRN: [id number] Date of Birth: [birth year]

## 2017-11-24 ENCOUNTER — Ambulatory Visit: Payer: Self-pay | Admitting: Physical Therapy

## 2017-11-24 NOTE — Progress Notes (Deleted)
Psychiatric Initial Adult Assessment   Patient Identification: Kimberly Potter MRN:  [id number] Date of Evaluation:  11/24/2017 Referral Source: *** Chief Complaint:   Visit Diagnosis: No diagnosis found.  History of Present Illness:   Kimberly Potter is a 32 y.o. year old female with a history of bipolar disorder, marijuana use by report, who is referred for     Associated Signs/Symptoms: Depression Symptoms:  {DEPRESSION SYMPTOMS:20000} (Hypo) Manic Symptoms:  {BHH MANIC SYMPTOMS:22872} Anxiety Symptoms:  {BHH ANXIETY SYMPTOMS:22873} Psychotic Symptoms:  {BHH PSYCHOTIC SYMPTOMS:22874} PTSD Symptoms: {BHH PTSD SYMPTOMS:22875}  Past Psychiatric History:  Outpatient:  Psychiatry admission:  Previous suicide attempt:  Past trials of medication:  History of violence:   Previous Psychotropic Medications: {YES/NO:21197}  Substance Abuse History in the last 12 months:  {yes no:314532}  Consequences of Substance Abuse: {BHH CONSEQUENCES OF SUBSTANCE ABUSE:22880}  Past Medical History:  Past Medical History:  Diagnosis Date  . Anxiety   . Bipolar 1 disorder (HCC)   . Depression   . Marijuana use 09/11/2017  . Nicotine dependence 09/11/2017    Past Surgical History:  Procedure Laterality Date  . ORIF TIBIA PLATEAU Left 09/10/2017   Procedure: OPEN REDUCTION INTERNAL FIXATION (ORIF) TIBIAL PLATEAU;  Surgeon: Myrene Galas, MD;  Location[facility];  Service: Orthopedics;  Laterality: Left;  . TYMPANOSTOMY      Family Psychiatric History: ***  Family History:  Family History  Problem Relation Age of Onset  . Diabetes Mother     Social History:   Social History   Socioeconomic History  . Marital status: Single    Spouse name: Not on file  . Number of children: Not on file  . Years of education: Not on file  . Highest education level: Not on file  Occupational History  . Not on file  Social Needs  . Financial resource strain: Not on file  . Food insecurity:   Worry: Not on file    Inability: Not on file  . Transportation needs:    Medical: Not on file    Non-medical: Not on file  Tobacco Use  . Smoking status: Former Smoker    Packs/day: 0.10    Years: 10.00    Pack years: 1.00    Types: Cigarettes  . Smokeless tobacco: Never Used  . Tobacco comment: last use unknown  Substance and Sexual Activity  . Alcohol use: Not Currently    Alcohol/week: 1.2 oz    Types: 2 Cans of beer per week  . Drug use: Yes    Types: Marijuana  . Sexual activity: Not on file  Lifestyle  . Physical activity:    Days per week: Not on file    Minutes per session: Not on file  . Stress: Not on file  Relationships  . Social connections:    Talks on phone: Not on file    Gets together: Not on file    Attends religious service: Not on file    Active member of club or organization: Not on file    Attends meetings of clubs or organizations: Not on file    Relationship status: Not on file  Other Topics Concern  . Not on file  Social History Narrative  . Not on file    Additional Social History: ***  Allergies:  No Known Allergies  Metabolic Disorder Labs: No results found for: HGBA1C, MPG No results found for: PROLACTIN No results found for: CHOL, TRIG, HDL, CHOLHDL, VLDL, LDLCALC   Current Medications: Current Outpatient Medications  Medication Sig Dispense Refill  . acetaminophen (TYLENOL) 500 MG tablet Take 2 tablets (1,000 mg total) by mouth every 6 (six) hours. 90 tablet 0  . cetirizine (ZYRTEC) 10 MG tablet Take 10 mg by mouth daily.    . cholecalciferol 5000 units TABS Take 1 tablet (5,000 Units total) by mouth daily. 30 tablet 6  . docusate sodium (COLACE) 100 MG capsule Take 1 capsule (100 mg total) by mouth 2 (two) times daily. (Patient not taking: Reported on 10/28/2017) 30 capsule 0  . enoxaparin (LOVENOX) 40 MG/0.4ML injection Inject 0.4 mLs (40 mg total) into the skin daily. (Patient not taking: Reported on 10/28/2017) 21 Syringe 0  .  escitalopram (LEXAPRO) 20 MG tablet Take 1 tablet (20 mg total) by mouth daily. 30 tablet 2  . methocarbamol (ROBAXIN) 500 MG tablet Take 1-2 tablets (500-1,000 mg total) by mouth every 6 (six) hours as needed for muscle spasms. 80 tablet 0  . polyethylene glycol (MIRALAX / GLYCOLAX) packet Take 17 g by mouth daily. (Patient not taking: Reported on 10/28/2017) 14 each 0  . vitamin C (VITAMIN C) 500 MG tablet Take 1 tablet (500 mg total) by mouth daily. 60 tablet 0   No current facility-administered medications for this visit.     Neurologic: Headache: No Seizure: No Paresthesias:No  Musculoskeletal: Strength & Muscle Tone: within normal limits Gait & Station: normal Patient leans: N/A  Psychiatric Specialty Exam: ROS  There were no vitals taken for this visit.There is no height or weight on file to calculate BMI.  General Appearance: Fairly Groomed  Eye Contact:  Good  Speech:  Clear and Coherent  Volume:  Normal  Mood:  {BHH MOOD:22306}  Affect:  {Affect (PAA):22687}  Thought Process:  Coherent  Orientation:  Full (Time, Place, and Person)  Thought Content:  Logical  Suicidal Thoughts:  {ST/HT (PAA):22692}  Homicidal Thoughts:  {ST/HT (PAA):22692}  Memory:  Immediate;   Good  Judgement:  {Judgement (PAA):22694}  Insight:  {Insight (PAA):22695}  Psychomotor Activity:  Normal  Concentration:  Concentration: Good and Attention Span: Good  Recall:  Good  Fund of Knowledge:Good  Language: Good  Akathisia:  No  Handed:  Right  AIMS (if indicated):  N/A  Assets:  Communication Skills Desire for Improvement  ADL's:  Intact  Cognition: WNL  Sleep:  ***   Assessment  Plan  The patient demonstrates the following risk factors for suicide: Chronic risk factors for suicide include: {Chronic Risk Factors for NWGNFAO:13086578}Suicide:30414011}. Acute risk factors for suicide include: {Acute Risk Factors for IONGEXB:28413244}Suicide:30414012}. Protective factors for this patient include: {Protective Factors for  Suicide WNUU:72536644}Risk:30414013}. Considering these factors, the overall suicide risk at this point appears to be {Desc; low/moderate/high:110033}. Patient {ACTION; IS/IS IHK:74259563}OT:21021397} appropriate for outpatient follow up.   Treatment Plan Summary: Plan as above   Neysa Hottereina Jaymond Waage, MD 7/30/20194:15 PM

## 2017-11-27 ENCOUNTER — Ambulatory Visit: Payer: Self-pay | Attending: Orthopedic Surgery | Admitting: Physical Therapy

## 2017-12-03 ENCOUNTER — Ambulatory Visit (HOSPITAL_COMMUNITY): Payer: Self-pay | Admitting: Psychiatry

## 2019-07-25 IMAGING — DX DG KNEE COMPLETE 4+V*L*
5 series · 5 of 5 positions shown · non-contrast
Comparison: None.

CLINICAL DATA: Left knee pain after tubing on river.

EXAM:
LEFT KNEE - COMPLETE 4+ VIEW

[knee ap (1 of 4)]
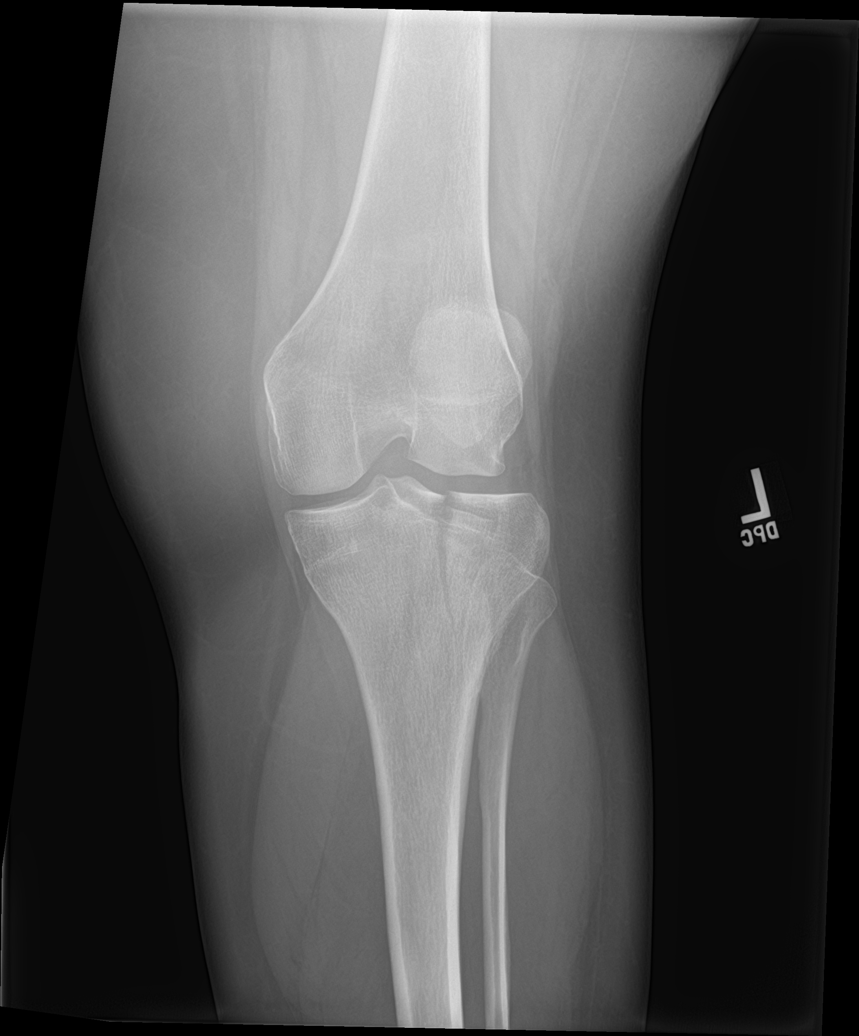

[knee lat]
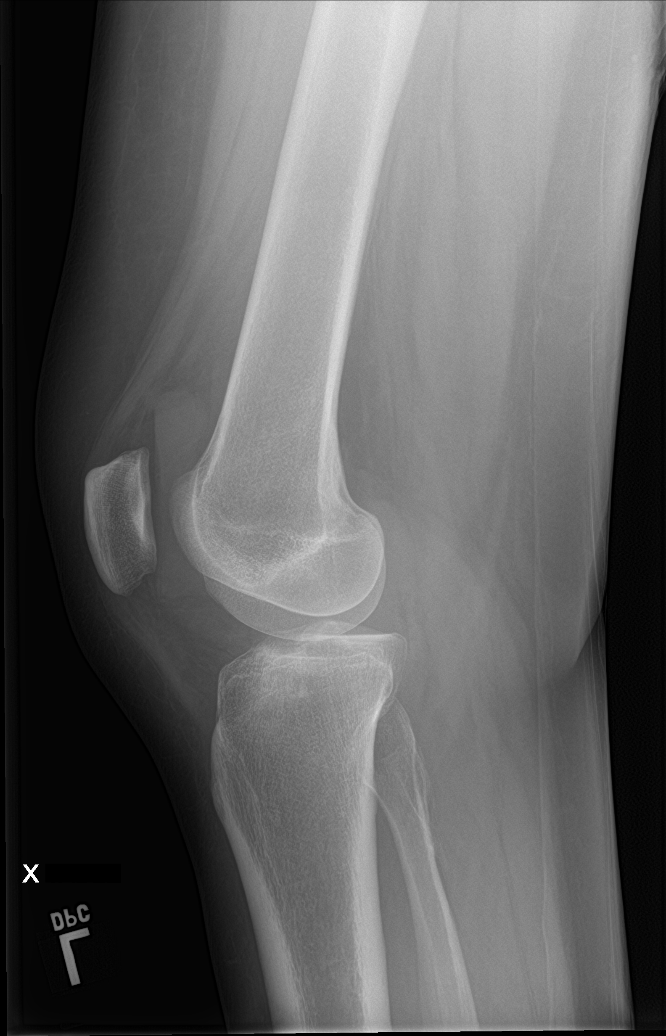

[knee ap (2 of 4)]
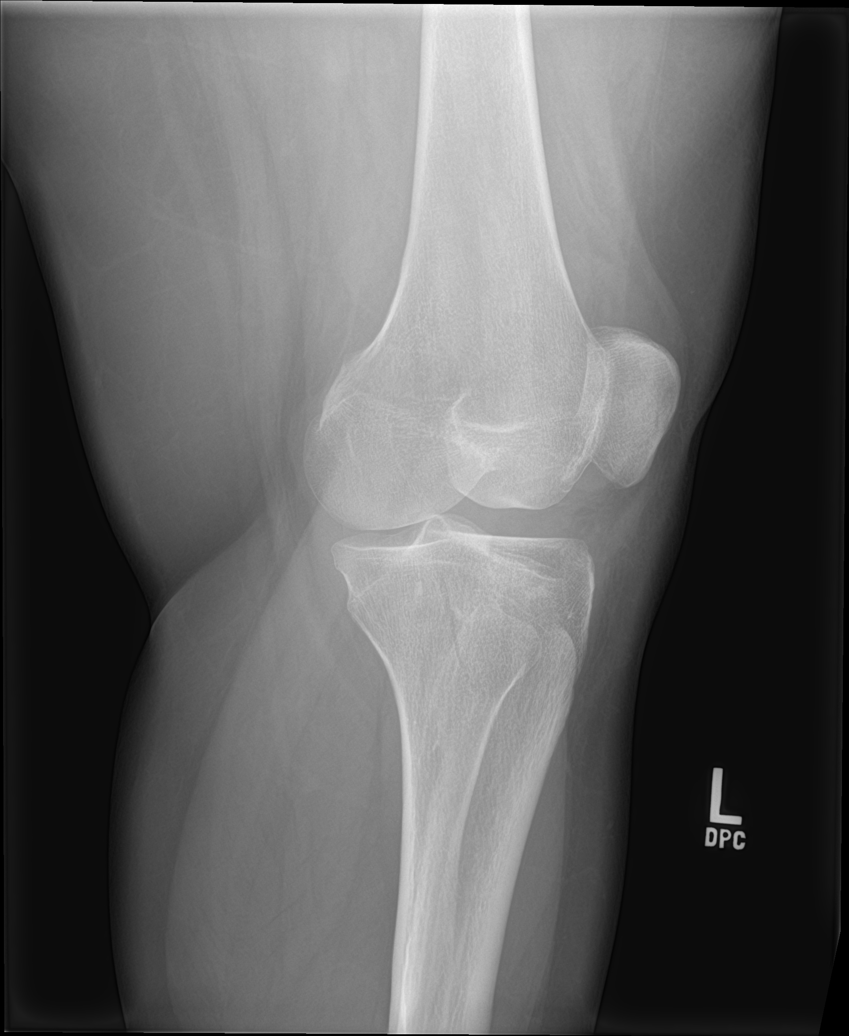

[knee ap (3 of 4)]
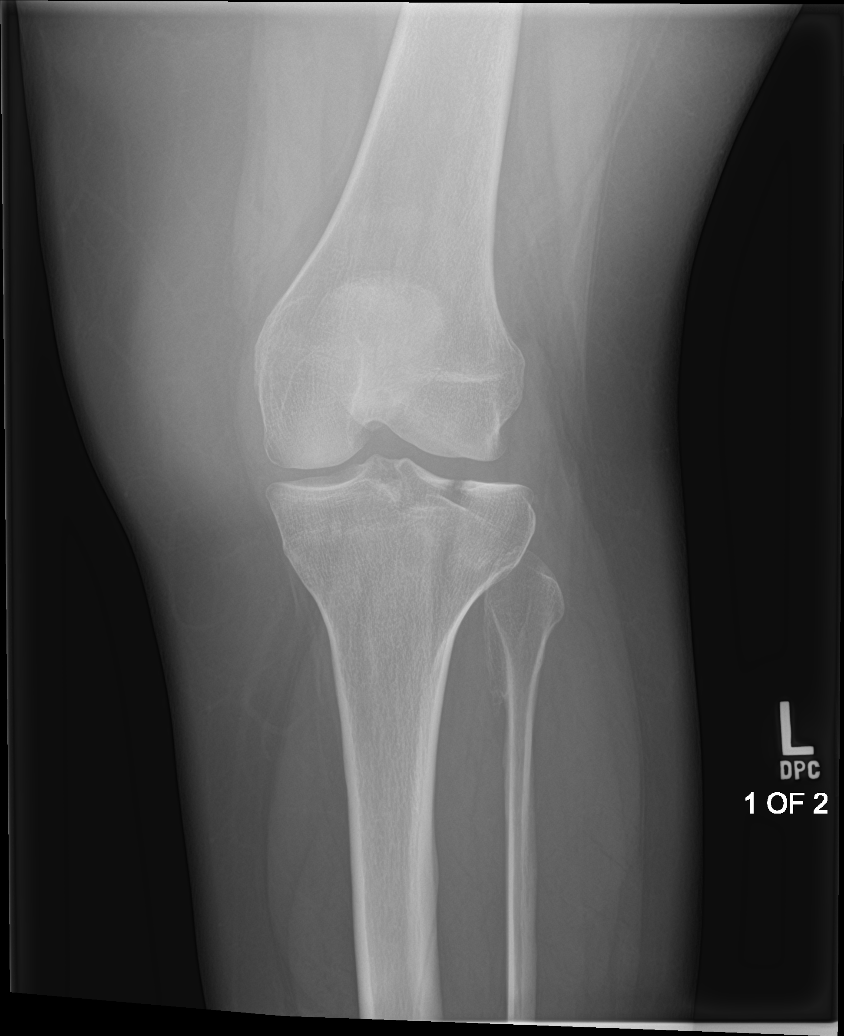

[knee ap (4 of 4)]
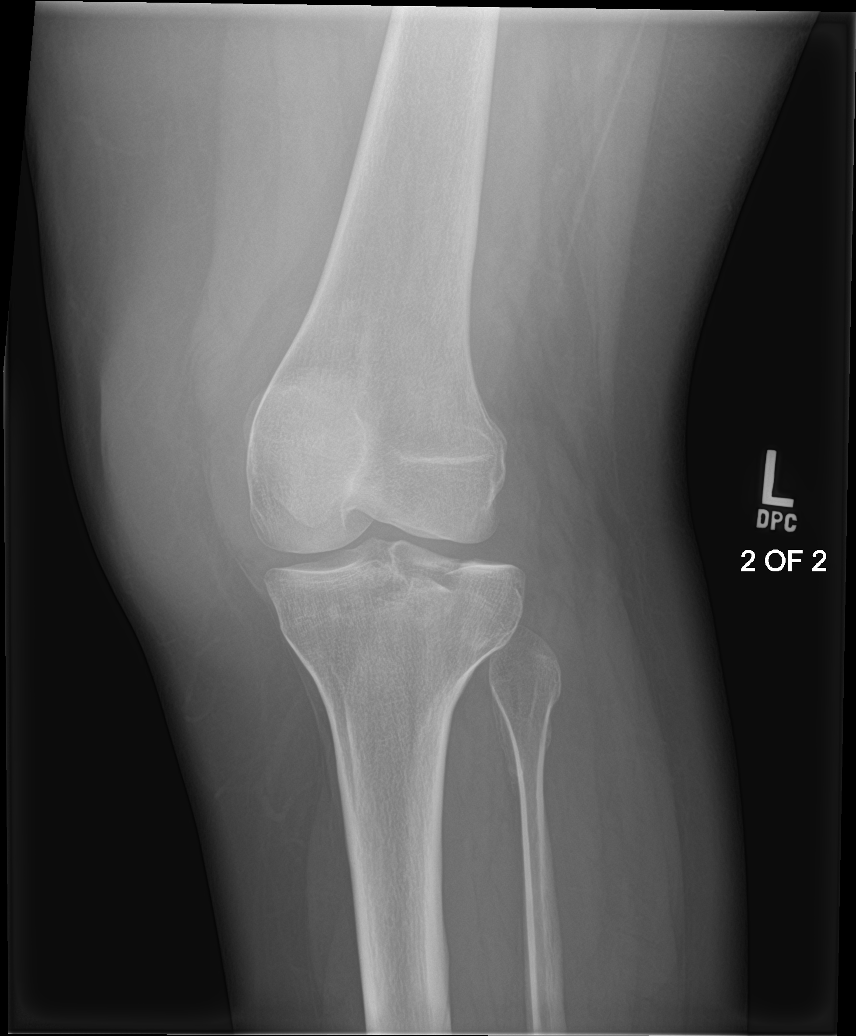

[5 of 5 positions shown; findings below may reference images not displayed]

FINDINGS: Mildly displaced longitudinal lateral tibial plateau fracture is
noted with intra-articular extension. Fat fluid level is noted in
the anterior joint space. The femur and fibula are unremarkable.
IMPRESSION: Mildly displaced lateral tibial plateau fracture with
intra-articular extension and longitudinal orientation.

## 2019-10-12 ENCOUNTER — Ambulatory Visit: Payer: Self-pay | Admitting: Family Medicine

## 2019-11-21 ENCOUNTER — Ambulatory Visit: Payer: Self-pay | Admitting: Family Medicine
